# Patient Record
Sex: Male | Born: 1945 | Race: White | Hispanic: No | Marital: Married | Smoking: Former smoker
Health system: Southern US, Community
[De-identification: ages and names within clinical notes are randomized; demographics above are authoritative.]

## PROBLEM LIST (undated history)

## (undated) DIAGNOSIS — I10 Essential (primary) hypertension: Secondary | ICD-10-CM

## (undated) DIAGNOSIS — M1990 Unspecified osteoarthritis, unspecified site: Secondary | ICD-10-CM

## (undated) DIAGNOSIS — T7840XA Allergy, unspecified, initial encounter: Secondary | ICD-10-CM

## (undated) DIAGNOSIS — H269 Unspecified cataract: Secondary | ICD-10-CM

## (undated) DIAGNOSIS — F419 Anxiety disorder, unspecified: Secondary | ICD-10-CM

## (undated) DIAGNOSIS — E785 Hyperlipidemia, unspecified: Secondary | ICD-10-CM

## (undated) DIAGNOSIS — J449 Chronic obstructive pulmonary disease, unspecified: Secondary | ICD-10-CM

## (undated) DIAGNOSIS — K279 Peptic ulcer, site unspecified, unspecified as acute or chronic, without hemorrhage or perforation: Secondary | ICD-10-CM

## (undated) DIAGNOSIS — K219 Gastro-esophageal reflux disease without esophagitis: Secondary | ICD-10-CM

## (undated) DIAGNOSIS — J45909 Unspecified asthma, uncomplicated: Secondary | ICD-10-CM

## (undated) DIAGNOSIS — B019 Varicella without complication: Secondary | ICD-10-CM

## (undated) HISTORY — DX: Gastro-esophageal reflux disease without esophagitis: K21.9

## (undated) HISTORY — DX: Varicella without complication: B01.9

## (undated) HISTORY — DX: Unspecified osteoarthritis, unspecified site: M19.90

## (undated) HISTORY — DX: Unspecified cataract: H26.9

## (undated) HISTORY — DX: Peptic ulcer, site unspecified, unspecified as acute or chronic, without hemorrhage or perforation: K27.9

## (undated) HISTORY — DX: Unspecified asthma, uncomplicated: J45.909

## (undated) HISTORY — DX: Essential (primary) hypertension: I10

## (undated) HISTORY — DX: Anxiety disorder, unspecified: F41.9

## (undated) HISTORY — DX: Allergy, unspecified, initial encounter: T78.40XA

## (undated) HISTORY — DX: Hyperlipidemia, unspecified: E78.5

## (undated) HISTORY — DX: Chronic obstructive pulmonary disease, unspecified: J44.9

---
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
## 2014-12-08 LAB — HEPATIC FUNCTION PANEL
ALK PHOS: 178 — AB (ref 25–125)
ALT: 85 — AB (ref 10–40)
AST: 39 (ref 14–40)

## 2014-12-09 LAB — CBC AND DIFFERENTIAL
HCT: 34 — AB (ref 41–53)
Hemoglobin: 10.9 — AB (ref 13.5–17.5)
Platelets: 265 (ref 150–399)
WBC: 6.9

## 2014-12-10 LAB — BASIC METABOLIC PANEL
BUN: 18 (ref 4–21)
CREATININE: 1.4 — AB (ref <=1.3)
POTASSIUM: 4.3 (ref 3.4–5.3)
SODIUM: 145 (ref 137–147)

## 2017-04-19 ENCOUNTER — Encounter: Payer: Self-pay | Admitting: Internal Medicine

## 2017-04-19 ENCOUNTER — Encounter: Payer: Self-pay | Admitting: Family Medicine

## 2017-04-19 ENCOUNTER — Ambulatory Visit (INDEPENDENT_AMBULATORY_CARE_PROVIDER_SITE_OTHER): Payer: Medicare Other | Admitting: Family Medicine

## 2017-04-19 ENCOUNTER — Telehealth: Payer: Self-pay

## 2017-04-19 VITALS — BP 124/74 | HR 74 | Temp 97.9°F | Ht 73.0 in | Wt 199.2 lb

## 2017-04-19 DIAGNOSIS — R1013 Epigastric pain: Secondary | ICD-10-CM | POA: Diagnosis not present

## 2017-04-19 DIAGNOSIS — R3912 Poor urinary stream: Secondary | ICD-10-CM | POA: Diagnosis not present

## 2017-04-19 DIAGNOSIS — K2100 Gastro-esophageal reflux disease with esophagitis, without bleeding: Secondary | ICD-10-CM | POA: Insufficient documentation

## 2017-04-19 DIAGNOSIS — R195 Other fecal abnormalities: Secondary | ICD-10-CM | POA: Diagnosis not present

## 2017-04-19 DIAGNOSIS — Z1159 Encounter for screening for other viral diseases: Secondary | ICD-10-CM

## 2017-04-19 DIAGNOSIS — K921 Melena: Secondary | ICD-10-CM | POA: Diagnosis not present

## 2017-04-19 DIAGNOSIS — N401 Enlarged prostate with lower urinary tract symptoms: Secondary | ICD-10-CM | POA: Diagnosis not present

## 2017-04-19 DIAGNOSIS — I959 Hypotension, unspecified: Secondary | ICD-10-CM | POA: Diagnosis not present

## 2017-04-19 DIAGNOSIS — N281 Cyst of kidney, acquired: Secondary | ICD-10-CM | POA: Diagnosis not present

## 2017-04-19 DIAGNOSIS — I700 Atherosclerosis of aorta: Secondary | ICD-10-CM | POA: Diagnosis not present

## 2017-04-19 DIAGNOSIS — E7800 Pure hypercholesterolemia, unspecified: Secondary | ICD-10-CM

## 2017-04-19 LAB — CBC
HCT: 42.8 % (ref 39.0–52.0)
Hemoglobin: 14.4 g/dL (ref 13.0–17.0)
MCHC: 33.7 g/dL (ref 30.0–36.0)
MCV: 95.5 fl (ref 78.0–100.0)
Platelets: 318 10*3/uL (ref 150.0–400.0)
RBC: 4.48 Mil/uL (ref 4.22–5.81)
RDW: 12.6 % (ref 11.5–15.5)
WBC: 6.6 10*3/uL (ref 4.0–10.5)

## 2017-04-19 LAB — LIPID PANEL
CHOL/HDL RATIO: 4
Cholesterol: 204 mg/dL — ABNORMAL HIGH (ref 0–200)
HDL: 46.1 mg/dL (ref >39.00)
NONHDL: 157.52
TRIGLYCERIDES: 202 mg/dL — AB (ref 0.0–149.0)
VLDL: 40.4 mg/dL — ABNORMAL HIGH (ref 0.0–40.0)

## 2017-04-19 LAB — URINALYSIS, ROUTINE W REFLEX MICROSCOPIC
Bilirubin Urine: NEGATIVE
HGB URINE DIPSTICK: NEGATIVE
KETONES UR: NEGATIVE
LEUKOCYTES UA: NEGATIVE
Nitrite: NEGATIVE
RBC / HPF: NONE SEEN (ref 0–?)
Specific Gravity, Urine: 1.025 (ref 1.000–1.030)
Total Protein, Urine: NEGATIVE
URINE GLUCOSE: NEGATIVE
Urobilinogen, UA: 0.2 (ref 0.0–1.0)
pH: 5.5 (ref 5.0–8.0)

## 2017-04-19 LAB — LDL CHOLESTEROL, DIRECT: Direct LDL: 143 mg/dL

## 2017-04-19 LAB — COMPREHENSIVE METABOLIC PANEL
ALT: 19 U/L (ref 0–53)
AST: 21 U/L (ref 0–37)
Albumin: 4.5 g/dL (ref 3.5–5.2)
Alkaline Phosphatase: 41 U/L (ref 39–117)
BILIRUBIN TOTAL: 0.5 mg/dL (ref 0.2–1.2)
BUN: 17 mg/dL (ref 6–23)
CALCIUM: 9.4 mg/dL (ref 8.4–10.5)
CHLORIDE: 104 meq/L (ref 96–112)
CO2: 24 meq/L (ref 19–32)
CREATININE: 0.98 mg/dL (ref 0.40–1.50)
GFR: 79.71 mL/min (ref >60.00)
GLUCOSE: 95 mg/dL (ref 70–99)
Potassium: 4.9 mEq/L (ref 3.5–5.1)
SODIUM: 142 meq/L (ref 135–145)
Total Protein: 6.7 g/dL (ref 6.0–8.3)

## 2017-04-19 LAB — PSA: PSA: 0.42 ng/mL (ref 0.10–4.00)

## 2017-04-19 LAB — TSH: TSH: 3.25 u[IU]/mL (ref 0.35–4.50)

## 2017-04-19 LAB — AMYLASE: Amylase: 68 U/L (ref 27–131)

## 2017-04-19 MED ORDER — TAMSULOSIN HCL 0.4 MG PO CAPS: 0.4000 mg | ORAL_CAPSULE | Freq: Every day | ORAL | 3 refills | Status: DC

## 2017-04-19 NOTE — Telephone Encounter (Signed)
FYI - patient was taking prilosec but he stopped taking it & does not want to take anything else right now.  Copied from CRM [id number]. Topic: General - Other >> Apr 19, 2017  2:23 PM Percival SpanishKennedy, Cheryl W wrote:  Pt said he is already taking omeprazole which is the same as prilosec which here already takes and he stop taking it because it caused him diarrh and upset his stomach. He said he want take anything right now

## 2017-04-19 NOTE — Progress Notes (Addendum)
Subjective:  Patient ID: Daniel Blake, male    DOB: April 28, 1945  Age: 72 y.o. MRN: [id number]  CC: Establish Care   HPI Daniel Blake presents for establishment of care and for follow-up of multiple medical issues.  He tells of ongoing hematochezia.  He also has midepigastric pain.  He also has reflux.  He was taking a medicine for his reflux that gave him diarrhea.  He stopped taking that medicine.  He is not sure what the medicine is.  He tells me that all of those medicines are the same.  He says that he had a colonoscopy a few years ago and multiple polyps were discovered.  He brings in blood work taken at the middle of last month that showed a low normal hemoglobin with a low normal iron studies.  Urine urinalysis and CMP were essentially normal.  He has smoked all of his life but quit 3 months ago. He also quit drinking 3 mos ago as well.  When I asked him about how much he was smoking it was hard to get a specific answer from him.  When I asked him about why he quits drinking he said that he wanted to feel better.  He said that he would average 2 beers every other day and that he was not drinking alcoholically.  He tells me that he has had ongoing issues with hypotension.  He is actually been hospitalized for this problem.  It was not clear to me then why he was taking Benicar 10 mg daily.  He saw his cardiologist last month who suggested that he stop it.  Patient denies a history of heart attack stroke or vascular disease.  He said that he was seeing the cardiologist because he has been hospitalized in the past for hypotension.  He is retired and lives with his wife.  He owned and operated several Administrator, sports.  History April has a past medical history of Allergy, Chicken pox, GERD (gastroesophageal reflux disease), Hyperlipidemia, and Hypertension.   He has a past surgical history that includes Tonsillectomy (1962).   His family history includes Arthritis in his sister; Asthma in his father;  COPD in his father; Early death in his brother and sister; Heart attack in his brother; Heart disease in his brother, brother, and mother; Hyperlipidemia in his father and mother; Hypertension in his father and mother; Stroke in his father.He reports that he has quit smoking. he has never used smokeless tobacco. He reports that he does not drink alcohol or use drugs.  Outpatient Medications Prior to Visit  Medication Sig Dispense Refill  . aspirin EC 81 MG tablet Take 81 mg by mouth daily.    Marland Kitchen ezetimibe (ZETIA) 10 MG tablet Take 1 tablet by mouth daily.    . fexofenadine (ALLEGRA) 180 MG tablet Take 180 mg by mouth daily.    . Saw Palmetto 160 MG CAPS Take 450 mg by mouth daily.    Marland Kitchen ALPRAZolam (XANAX) 0.25 MG tablet Take 1 tablet by mouth two times daily as needed for sleep or anxiety.    Marland Kitchen olmesartan (BENICAR) 20 MG tablet Take 10 mg by mouth daily.     No facility-administered medications prior to visit.     ROS Review of Systems  Constitutional: Positive for fatigue. Negative for chills and fever.  HENT: Negative.   Eyes: Negative.   Respiratory: Negative for shortness of breath and wheezing.   Cardiovascular: Negative for chest pain and palpitations.  Gastrointestinal: Positive for abdominal  pain and blood in stool. Negative for constipation, nausea and vomiting.  Endocrine: Negative for polyphagia and polyuria.  Genitourinary: Positive for difficulty urinating, frequency and urgency. Negative for hematuria.  Musculoskeletal: Negative for gait problem and myalgias.  Skin: Negative.   Allergic/Immunologic: Negative for immunocompromised state.  Neurological: Negative for seizures, speech difficulty and headaches.  Hematological: Does not bruise/bleed easily.  Psychiatric/Behavioral: Negative.     Objective:  BP 124/74 (BP Location: Left Arm, Patient Position: Sitting, Cuff Size: Normal)   Pulse 74   Temp 97.9 F (36.6 C) (Oral)   Ht 6\' 1"  (1.854 m)   Wt 199 lb 4 oz (90.4  kg)   SpO2 97%   BMI 26.29 kg/m   Physical Exam  Constitutional: He is oriented to person, place, and time. He appears well-developed and well-nourished. No distress.  HENT:  Head: Normocephalic and atraumatic.  Right Ear: External ear normal.  Left Ear: External ear normal.  Mouth/Throat: Oropharynx is clear and moist. No oropharyngeal exudate.  Eyes: Conjunctivae are normal. Pupils are equal, round, and reactive to light. Right eye exhibits no discharge. Left eye exhibits no discharge. No scleral icterus.  Neck: Neck supple. No JVD present. No tracheal deviation present. No thyromegaly present.  Cardiovascular: Normal rate, regular rhythm and normal heart sounds.  Pulmonary/Chest: Effort normal and breath sounds normal. No stridor. No respiratory distress. He has no wheezes. He has no rales.  Abdominal: Bowel sounds are normal. He exhibits no distension. There is no tenderness. There is no rebound and no guarding.  Genitourinary: Rectal exam shows external hemorrhoid (there is an external hemorrhoid that is non thrombosed and not bleeding. ) and guaiac positive stool. Rectal exam shows no internal hemorrhoid, no fissure, no mass, no tenderness and anal tone normal. Prostate is not enlarged and not tender.  Lymphadenopathy:    He has no cervical adenopathy.  Neurological: He is alert and oriented to person, place, and time.  Skin: Skin is warm and dry. He is not diaphoretic.  Psychiatric: He has a normal mood and affect. His behavior is normal.      Assessment & Plan:   [name] was seen today for establish care.  Diagnoses and all orders for this visit:  Heme positive stool -     Cancel: Fecal occult blood, imunochemical -     US Abdomen Complete; Future -     Ambulatory referral to Gastroenterology -     Cancel: Fecal occult blood, imunochemical; Future  Epigastric pain -     CBC -     Comprehensive metabolic panel -     Amylase -     US Abdomen Complete; Future -      Ambulatory referral to Gastroenterology  Hematochezia -     Cancel: Fecal occult blood, imunochemical -     CBC -     Ambulatory referral to Gastroenterology  Reflux esophagitis -     Comprehensive metabolic panel -     Ambulatory referral to Gastroenterology  Hypotension, unspecified hypotension type -     TSH -     Urinalysis, Routine w reflex microscopic  Elevated cholesterol -     Comprehensive metabolic panel -     Lipid panel  Benign prostatic hyperplasia with weak urinary stream -     PSA -     tamsulosin (FLOMAX) 0.4 MG CAPS capsule; Take 1 capsule (0.4 mg total) by mouth daily.  Encounter for hepatitis C screening test for low risk patient -  Hepatitis C antibody  Complex renal cyst  Atherosclerosis of aorta (HCC)  Other orders -     LDL cholesterol, direct   I have discontinued Jillyn HiddenGary Killgore's ALPRAZolam and olmesartan. I am also having him start on tamsulosin. Additionally, I am having him maintain his ezetimibe, Saw Palmetto, fexofenadine, and aspirin EC.  Meds ordered this encounter  Medications  . tamsulosin (FLOMAX) 0.4 MG CAPS capsule    Sig: Take 1 capsule (0.4 mg total) by mouth daily.    Dispense:  30 capsule    Refill:  3   I have asked the patient to hold his Benicar and start checking his blood pressures.  We will give him a trial of Flomax.  Even though the patient had a recent colonoscopy he tells me, combination of midepigastric pain, reflux, hematochezia and a low hemoglobin warrant abdominal ultrasound and referral to GI.  Follow-up: Return in about 1 month (around 05/20/2017).  Mliss SaxWilliam Alfred Kremer, MD

## 2017-04-20 ENCOUNTER — Ambulatory Visit (HOSPITAL_BASED_OUTPATIENT_CLINIC_OR_DEPARTMENT_OTHER)
Admission: RE | Admit: 2017-04-20 | Discharge: 2017-04-20 | Disposition: A | Discharge status: Home / Self Care | Payer: Medicare Other | Source: Ambulatory Visit | Attending: Family Medicine | Admitting: Family Medicine

## 2017-04-20 ENCOUNTER — Encounter (INDEPENDENT_AMBULATORY_CARE_PROVIDER_SITE_OTHER): Payer: Self-pay

## 2017-04-20 DIAGNOSIS — I700 Atherosclerosis of aorta: Secondary | ICD-10-CM | POA: Diagnosis not present

## 2017-04-20 DIAGNOSIS — N281 Cyst of kidney, acquired: Secondary | ICD-10-CM | POA: Diagnosis not present

## 2017-04-20 DIAGNOSIS — R1013 Epigastric pain: Secondary | ICD-10-CM | POA: Diagnosis not present

## 2017-04-20 DIAGNOSIS — R195 Other fecal abnormalities: Secondary | ICD-10-CM

## 2017-04-20 LAB — HEPATITIS C ANTIBODY
HEP C AB: NONREACTIVE
SIGNAL TO CUT-OFF: 0.01 (ref <1.00)

## 2017-04-20 NOTE — Addendum Note (Signed)
Addended by: Varney BilesWIESNER, Courteney Alderete M on: 04/20/2017 07:52 AM   Modules accepted: Orders

## 2017-04-21 ENCOUNTER — Other Ambulatory Visit: Payer: Medicare Other

## 2017-04-21 ENCOUNTER — Other Ambulatory Visit: Payer: Self-pay

## 2017-04-21 DIAGNOSIS — R195 Other fecal abnormalities: Secondary | ICD-10-CM

## 2017-04-21 DIAGNOSIS — K921 Melena: Secondary | ICD-10-CM

## 2017-04-21 NOTE — Addendum Note (Signed)
Addended by: Arva ChafeGARCIA, Lily Velasquez M on: 04/21/2017 12:31 PM   Modules accepted: Orders

## 2017-04-21 NOTE — Progress Notes (Signed)
Pt dropped off hemoccult kit.

## 2017-04-24 DIAGNOSIS — I700 Atherosclerosis of aorta: Secondary | ICD-10-CM | POA: Insufficient documentation

## 2017-04-24 DIAGNOSIS — N281 Cyst of kidney, acquired: Secondary | ICD-10-CM | POA: Insufficient documentation

## 2017-04-25 ENCOUNTER — Other Ambulatory Visit: Payer: Self-pay

## 2017-04-25 ENCOUNTER — Other Ambulatory Visit (INDEPENDENT_AMBULATORY_CARE_PROVIDER_SITE_OTHER): Payer: Medicare Other

## 2017-04-25 DIAGNOSIS — K921 Melena: Secondary | ICD-10-CM

## 2017-04-25 DIAGNOSIS — Z1211 Encounter for screening for malignant neoplasm of colon: Secondary | ICD-10-CM

## 2017-04-25 LAB — FECAL OCCULT BLOOD, IMMUNOCHEMICAL: Fecal Occult Bld: NEGATIVE

## 2017-05-19 ENCOUNTER — Encounter: Payer: Self-pay | Admitting: Family Medicine

## 2017-05-19 ENCOUNTER — Ambulatory Visit (INDEPENDENT_AMBULATORY_CARE_PROVIDER_SITE_OTHER): Payer: Medicare Other | Admitting: Family Medicine

## 2017-05-19 VITALS — BP 126/80 | HR 68 | Ht 73.0 in | Wt 201.5 lb

## 2017-05-19 DIAGNOSIS — N281 Cyst of kidney, acquired: Secondary | ICD-10-CM

## 2017-05-19 DIAGNOSIS — K921 Melena: Secondary | ICD-10-CM

## 2017-05-19 DIAGNOSIS — K2100 Gastro-esophageal reflux disease with esophagitis, without bleeding: Secondary | ICD-10-CM

## 2017-05-19 DIAGNOSIS — R195 Other fecal abnormalities: Secondary | ICD-10-CM

## 2017-05-19 NOTE — Progress Notes (Signed)
Subjective:  Patient ID: Daniel Blake, male    DOB: 1945-06-25  Age: 72 y.o. MRN: [id number]  CC: Follow-up   HPI Daniel Blake presents for follow-up of his hypotension, midepigastric pain and heme positive stool.  He is no longer taking Benicar.  He brings in a list of blood pressures taken since our last visit.  They are all in the less than 140 over less than 90 range.  It sounds as though he is taking the omeprazole with some relief of his midepigastric pain.  He is scheduled to see gastroenterology on the 15th of this month for follow-up of his midepigastric pain and heme-positive stool.  Outpatient Medications Prior to Visit  Medication Sig Dispense Refill  . aspirin EC 81 MG tablet Take 81 mg by mouth daily.    Marland Kitchen. ezetimibe (ZETIA) 10 MG tablet Take 1 tablet by mouth daily.    . fexofenadine (ALLEGRA) 180 MG tablet Take 180 mg by mouth daily.    . Saw Palmetto 160 MG CAPS Take 450 mg by mouth daily.    Marland Kitchen. ALPRAZolam (XANAX) 0.25 MG tablet Take 1 tablet by mouth 2 (two) times daily.  0  . tamsulosin (FLOMAX) 0.4 MG CAPS capsule Take 1 capsule (0.4 mg total) by mouth daily. 30 capsule 3   No facility-administered medications prior to visit.     ROS Review of Systems  Respiratory: Negative.   Cardiovascular: Negative.   Gastrointestinal: Negative.   Genitourinary: Negative.   Psychiatric/Behavioral: Negative.     Objective:  BP 126/80 (BP Location: Left Arm, Patient Position: Sitting, Cuff Size: Normal)   Pulse 68   Ht 6\' 1"  (1.854 m)   Wt 201 lb 8 oz (91.4 kg)   SpO2 97%   BMI 26.58 kg/m   BP Readings from Last 3 Encounters:  05/19/17 126/80  04/19/17 124/74    Wt Readings from Last 3 Encounters:  05/19/17 201 lb 8 oz (91.4 kg)  04/19/17 199 lb 4 oz (90.4 kg)    Physical Exam  Constitutional: He appears well-developed and well-nourished. No distress.  HENT:  Head: Normocephalic and atraumatic.  Right Ear: External ear normal.  Left Ear: External ear normal.    Eyes: Right eye exhibits no discharge. Left eye exhibits no discharge. No scleral icterus.  Pulmonary/Chest: Effort normal.  Neurological: He is alert.  Skin: He is not diaphoretic.  Psychiatric: He has a normal mood and affect. His behavior is normal.    Lab Results  Component Value Date   WBC 6.6 04/19/2017   HGB 14.4 04/19/2017   HCT 42.8 04/19/2017   PLT 318.0 04/19/2017   GLUCOSE 95 04/19/2017   CHOL 204 (H) 04/19/2017   TRIG 202.0 (H) 04/19/2017   HDL 46.10 04/19/2017   LDLDIRECT 143.0 04/19/2017   ALT 19 04/19/2017   AST 21 04/19/2017   NA 142 04/19/2017   K 4.9 04/19/2017   CL 104 04/19/2017   CREATININE 0.98 04/19/2017   BUN 17 04/19/2017   CO2 24 04/19/2017   TSH 3.25 04/19/2017   PSA 0.42 04/19/2017    [place] Abdomen Complete  Result Date: 04/21/2017 CLINICAL DATA:  Epigastric pain. EXAM: ABDOMEN ULTRASOUND COMPLETE COMPARISON:  12/08/2014 FINDINGS: Gallbladder: No gallstones or wall thickening visualized. No sonographic Murphy sign noted by sonographer. Common bile duct: Diameter: 3.3 mm Liver: No focal lesion identified. Within normal limits in parenchymal echogenicity. Portal vein is patent on color Doppler imaging with normal direction of blood flow towards the liver. IVC: No  abnormality visualized. Pancreas: Visualized portion unremarkable. Spleen: Measures 13.3 cm and has a volume of 367 cc. Right Kidney: Length: 11.2 cm. Small right kidney cysts are identified within the mid and lower pole. The cyst in the midpole has a maximum diameter 1.3 cm and is complicated by internal areas of septation. 1.1 cm cyst within the inferior pole of the right kidney may also contains thin internal areas of septation. Left Kidney: Length: 12.2 cm. Echogenicity within normal limits. No mass or hydronephrosis visualized. Abdominal aorta: No aneurysm visualized. Aortic atherosclerosis noted. Other findings: None. IMPRESSION: 1. No acute findings. 2. Right kidney cysts appear mildly  complicated by internal areas of septation. These do not meet criteria for simple cysts. 3. Aortic atherosclerosis. Electronically Signed   By: Signa Kell M.D.   On: 04/21/2017 08:51    Assessment & Plan:   Daniel Blake was seen today for follow-up.  Diagnoses and all orders for this visit:  Hematochezia  Heme positive stool  Complex renal cyst -     Ambulatory referral to Urology  Reflux esophagitis   I have discontinued Daniel Blake's tamsulosin. I am also having him maintain his ezetimibe, Saw Palmetto, fexofenadine, aspirin EC, and ALPRAZolam.  No orders of the defined types were placed in this encounter.  Patient is a gastroenterology on the 15th and will be scheduled to see urology for his complex renal cyst.  Follow-up: No Follow-up on file.  Mliss Sax, MD

## 2017-05-25 ENCOUNTER — Ambulatory Visit (INDEPENDENT_AMBULATORY_CARE_PROVIDER_SITE_OTHER): Payer: Medicare Other | Admitting: Internal Medicine

## 2017-05-25 ENCOUNTER — Encounter: Payer: Self-pay | Admitting: Internal Medicine

## 2017-05-25 VITALS — BP 128/68 | HR 62 | Ht 73.0 in | Wt 201.0 lb

## 2017-05-25 DIAGNOSIS — Z86010 Personal history of colonic polyps: Secondary | ICD-10-CM | POA: Diagnosis not present

## 2017-05-25 DIAGNOSIS — K2100 Gastro-esophageal reflux disease with esophagitis, without bleeding: Secondary | ICD-10-CM

## 2017-05-25 DIAGNOSIS — K222 Esophageal obstruction: Secondary | ICD-10-CM

## 2017-05-25 DIAGNOSIS — Z8719 Personal history of other diseases of the digestive system: Secondary | ICD-10-CM | POA: Diagnosis not present

## 2017-05-25 DIAGNOSIS — R1310 Dysphagia, unspecified: Secondary | ICD-10-CM

## 2017-05-25 NOTE — Patient Instructions (Signed)
Continue to take your Omeprazole over the counter.  [facility]  Please follow up in one year

## 2017-05-25 NOTE — Progress Notes (Signed)
HISTORY OF PRESENT ILLNESS:  Daniel Blake is a 72 y.o. male referred by Dr. Farris HasKramer with a chief complaint of "stomach issues".. Patient has a GI history and [place] as recent as less than 1 year ago. Wants to change to this healthcare system. Saw his PCP regarding possible GI bleeding. However, Hemoccult testing was negative. Review of laboratories from 04/19/2017 shows normal hemoglobin of 14.4. Comprehensive metabolic panel is normal. Review of outside GI records from Dr. Conley RollsLe dated 06/14/2016 states that the patient underwent upper endoscopy and colonoscopy in January 2017. Upper endoscopy was said to show grade A erosive esophagitis and small duodenal ulcers. Gastric biopsies were negative for Helicobacter pylori. Also noted to have an esophageal stricture. He does have some mild intermittent solid food dysphagia which she states is not progressive or bothersome. Colonoscopy revealed several tubular adenomas. No comment regarding follow-up made. Patient tells me that he has intermittent problems with epigastric pain and active reflux symptoms. He does note that when taking omeprazole regularly, symptoms improved. However, he is concerned about taking medication regularly as the over-the-counter package insert says not to take for more than 14 days. Because of abdominal complaints she did undergo abdominal ultrasound on 04/20/2017. This was negative for acute abnormalities. Normal gallbladder without stones. Does have alternating bowel habits but denies melena or hematochezia except for blood on the tissue with wiping that is attributed to known hemorrhoids.  REVIEW OF SYSTEMS:  All non-GI ROS negative unless otherwise stated in the history of present illness except for arthritis, depression, fatigue, sleeping problems, excessive urination, urinary leakage  Past Medical History:  Diagnosis Date  . Allergy   . Chicken pox   . GERD (gastroesophageal reflux disease)   . Hyperlipidemia   . Hypertension    . PUD (peptic ulcer disease)     Past Surgical History:  Procedure Laterality Date  . TONSILLECTOMY  1962    Social History Daniel LatusGary Knarr  reports that he has quit smoking. he has never used smokeless tobacco. He reports that he does not drink alcohol or use drugs.  family history includes Arthritis in his sister; Asthma in his father; COPD in his father; Early death in his brother and sister; Heart attack in his brother; Heart disease in his brother, brother, and mother; Hyperlipidemia in his father and mother; Hypertension in his father and mother; Stroke in his father.  Allergies  Allergen Reactions  . Bee Venom Anaphylaxis  . Amlodipine Other (See Comments)    Tired , foggy feeling  Tired , foggy feeling    . Olmesartan Other (See Comments)    AKI  . Pravastatin Other (See Comments)    Myalgia Myalgia   . Rosuvastatin Other (See Comments)       PHYSICAL EXAMINATION: Vital signs: BP 128/68   Pulse 62   Ht 6\' 1"  (1.854 m)   Wt 201 lb (91.2 kg)   BMI 26.52 kg/m   Constitutional: generally well-appearing, no acute distress Psychiatric: alert and oriented x3, cooperative Eyes: extraocular movements intact, anicteric, conjunctiva pink Mouth: oral pharynx moist, no lesions Neck: supple no lymphadenopathy Cardiovascular: heart regular rate and rhythm, no murmur Lungs: clear to auscultation bilaterally Abdomen: soft, nontender, nondistended, no obvious ascites, no peritoneal signs, normal bowel sounds, no organomegaly Rectal: Omitted Extremities: no clubbing, cyanosis, or lower extremity edema bilaterally Skin: no lesions on visible extremities Neuro: No focal deficits. Cranial nerves intact  ASSESSMENT:  #1. GERD with history of erosive esophagitis. Active symptoms off PPI. #2. Intermittent  solid food dysphagia secondary to known peptic stricture #3. History of duodenal ulcer. Negative Helicobacter pylori testing #4. Last colonoscopy elsewhere January 2017 with  tubular adenomas #5. History of duodenal ulcer  PLAN:  #1. Reflux precautions. Also, avoid NSAIDs. Okay to take baby aspirin if medically necessary #2. Omeprazole 20 mg daily. Advised to take every day given his chronic symptoms, the presence of esophageal erosions, and the presence of an esophageal stricture. Offered prescription. The patient prefers OTC #3. Obtain formal colonoscopy, endoscopy, and pathology reports from Dr. Conley Rolls. Release signed #4. Surveillance colonoscopy likely due around January 2022 #5. Routine office follow-up one year. He agrees to follow-up

## 2017-09-16 ENCOUNTER — Other Ambulatory Visit: Payer: Self-pay | Admitting: Family Medicine

## 2017-10-09 ENCOUNTER — Encounter: Payer: Self-pay | Admitting: Family Medicine

## 2017-10-09 ENCOUNTER — Ambulatory Visit (INDEPENDENT_AMBULATORY_CARE_PROVIDER_SITE_OTHER): Payer: Medicare Other | Admitting: Family Medicine

## 2017-10-09 VITALS — BP 118/80 | HR 68 | Temp 97.7°F | Ht 73.0 in | Wt 202.2 lb

## 2017-10-09 DIAGNOSIS — H60501 Unspecified acute noninfective otitis externa, right ear: Secondary | ICD-10-CM | POA: Insufficient documentation

## 2017-10-09 MED ORDER — NEOMYCIN-POLYMYXIN-HC 1 % OT SOLN: 3.0000 [drp] | Freq: Three times a day (TID) | OTIC | 0 refills | Status: AC

## 2017-10-09 NOTE — Progress Notes (Signed)
Subjective:  Patient ID: Daniel Blake, male    DOB: [birth year]  Age: 72 y.o. MRN: [id number]  CC: Pain (right side below the ear, pain has diminished now, but pain was worse last week.)   HPI Daniel Blake presents for evaluation of a 2 to 3-day history of right ear discomfort.  Hearing is not affected with [place] with this but he does have fluttering in the ear at times with some ear congestion.  He has ongoing postnasal drip that he treats with Allegra.  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  He has ground his teeth in the past when he was under stress.  He is not doing this currently according to his wife.  He does not have pain when he chews his food.  He does feel or hear a click sometimes when he open his opens his mouth wide.  Outpatient Medications Prior to Visit  Medication Sig Dispense Refill  . ALPRAZolam (XANAX) 0.25 MG tablet TAKE 1 TABLET BY MOUTH TWICE DAILY AS NEEDED FOR SLEEP OR ANXIETY 60 tablet 0  . aspirin EC 81 MG tablet Take 81 mg by mouth daily.    Marland Kitchen BENICAR 20 MG tablet Take 1 tablet by mouth daily.  0  . calcium-vitamin D (OSCAL WITH D) 500-200 MG-UNIT tablet Take 1 tablet by mouth daily with breakfast.    . cholecalciferol (VITAMIN D) 1000 units tablet Take 1,000 Units by mouth daily.    Marland Kitchen ezetimibe (ZETIA) 10 MG tablet Take 1 tablet by mouth daily.    . fexofenadine (ALLEGRA) 180 MG tablet Take 180 mg by mouth daily.    Daniel Blake Oil 1000 MG CAPS Take 1 capsule by mouth daily.    . magnesium 30 MG tablet Take 30 mg by mouth daily.    Marland Kitchen omeprazole (PRILOSEC) 20 MG capsule Take 20 mg by mouth daily.    . Saw Palmetto 160 MG CAPS Take 450 mg by mouth daily.    . tamsulosin (FLOMAX) 0.4 MG CAPS capsule Take 1 capsule by mouth daily.  3  . Testosterone 20.25 MG/ACT (1.62%) GEL APPLY 2 SPRAYS ON THE SKIN D  2  . zinc gluconate 50 MG tablet Take 50 mg by mouth daily.    Marland Kitchen testosterone (ANDROGEL) 50  MG/5GM (1%) GEL Place 5 g onto the skin daily.     No facility-administered medications prior to visit.     ROS Review of Systems  Constitutional: Negative for chills, fatigue and fever.  HENT: Positive for ear pain and postnasal drip. Negative for ear discharge, hearing loss, rhinorrhea, sore throat, trouble swallowing and voice change.   Eyes: Negative.   Respiratory: Negative.   Cardiovascular: Negative.   Gastrointestinal: Negative.   Musculoskeletal: Positive for arthralgias.  Skin: Negative for color change, pallor and rash.  Neurological: Negative for syncope, weakness and headaches.  Hematological: Negative for adenopathy. Does not bruise/bleed easily.  Psychiatric/Behavioral: Negative.     Objective:  BP 118/80   Pulse 68   Temp 97.7 F (36.5 C)   Ht 6\' 1"  (1.854 m)   Wt 202 lb 4 oz (91.7 kg)   SpO2 98%   BMI 26.68 kg/m   BP Readings from Last 3 Encounters:  10/09/17 118/80  05/25/17 128/68  05/19/17 126/80    Wt Readings from Last 3 Encounters:  10/09/17 202 lb 4 oz (91.7 kg)  05/25/17 201 lb (  91.2 kg)  05/19/17 201 lb 8 oz (91.4 kg)    Physical Exam  Constitutional: He is oriented to person, place, and time. He appears well-developed and well-nourished. No distress.  HENT:  Head: Normocephalic and atraumatic.    Right Ear: Hearing and tympanic membrane normal.  Left Ear: Hearing, tympanic membrane, external ear and ear canal normal.  Ears:  Nose: Nose normal.  Mouth/Throat: Oropharynx is clear and moist. No oropharyngeal exudate.  Eyes: Pupils are equal, round, and reactive to light. Conjunctivae and EOM are normal. Right eye exhibits no discharge. Left eye exhibits no discharge. No scleral icterus.  Neck: Normal range of motion. Neck supple. No JVD present. No tracheal deviation present. No thyromegaly present.  Cardiovascular: Normal rate, regular rhythm and normal heart sounds.  Pulmonary/Chest: Effort normal and breath sounds normal.    Lymphadenopathy:    He has no cervical adenopathy.  Neurological: He is alert and oriented to person, place, and time.  Skin: Skin is warm and dry. He is not diaphoretic.  Psychiatric: He has a normal mood and affect. His behavior is normal.    Lab Results  Component Value Date   WBC 6.6 04/19/2017   HGB 14.4 04/19/2017   HCT 42.8 04/19/2017   PLT 318.0 04/19/2017   GLUCOSE 95 04/19/2017   CHOL 204 (H) 04/19/2017   TRIG 202.0 (H) 04/19/2017   HDL 46.10 04/19/2017   LDLDIRECT 143.0 04/19/2017   ALT 19 04/19/2017   AST 21 04/19/2017   NA 142 04/19/2017   K 4.9 04/19/2017   CL 104 04/19/2017   CREATININE 0.98 04/19/2017   BUN 17 04/19/2017   CO2 24 04/19/2017   TSH 3.25 04/19/2017   PSA 0.42 04/19/2017    [place] Abdomen Complete  Result Date: 04/21/2017 CLINICAL DATA:  Epigastric pain. EXAM: ABDOMEN ULTRASOUND COMPLETE COMPARISON:  12/08/2014 FINDINGS: Gallbladder: No gallstones or wall thickening visualized. No sonographic Murphy sign noted by sonographer. Common bile duct: Diameter: 3.3 mm Liver: No focal lesion identified. Within normal limits in parenchymal echogenicity. Portal vein is patent on color Doppler imaging with normal direction of blood flow towards the liver. IVC: No abnormality visualized. Pancreas: Visualized portion unremarkable. Spleen: Measures 13.3 cm and has a volume of 367 cc. Right Kidney: Length: 11.2 cm. Small right kidney cysts are identified within the mid and lower pole. The cyst in the midpole has a maximum diameter 1.3 cm and is complicated by internal areas of septation. 1.1 cm cyst within the inferior pole of the right kidney may also contains thin internal areas of septation. Left Kidney: Length: 12.2 cm. Echogenicity within normal limits. No mass or hydronephrosis visualized. Abdominal aorta: No aneurysm visualized. Aortic atherosclerosis noted. Other findings: None. IMPRESSION: 1. No acute findings. 2. Right kidney cysts appear mildly complicated by  internal areas of septation. These do not meet criteria for simple cysts. 3. Aortic atherosclerosis. Electronically Signed   By: Signa Kellaylor  Stroud M.D.   On: 04/21/2017 08:51    Assessment & Plan:   Jillyn HiddenGary was seen today for pain.  Diagnoses and all orders for this visit:  Acute otitis externa of right ear, unspecified type -     NEOMYCIN-POLYMYXIN-HYDROCORTISONE (CORTISPORIN) 1 % SOLN OTIC solution; Place 3 drops into the right ear every 8 (eight) hours for 5 days.  She has I am having Marlana LatusGary Ginther start on NEOMYCIN-POLYMYXIN-HYDROCORTISONE. I am also having him maintain his ezetimibe, Saw Palmetto, fexofenadine, aspirin EC, cholecalciferol, calcium-vitamin D, Krill Oil, zinc gluconate, magnesium, omeprazole, ALPRAZolam, Testosterone,  tamsulosin, and BENICAR.  Meds ordered this encounter  Medications  . NEOMYCIN-POLYMYXIN-HYDROCORTISONE (CORTISPORIN) 1 % SOLN OTIC solution    Sig: Place 3 drops into the right ear every 8 (eight) hours for 5 days.    Dispense:  10 mL    Refill:  0   Believe possible early otitis externa.  And will treat with Cortisporin Otic.  Suggested that he use Flonase given to help control his postnasal drip which would help his eustachian tube function.  Follow-up with dentist or further development of TMJ pain or clicking.  Follow-up here in a week or so if he is not improving.  Follow-up: Return in about 1 week (around 10/16/2017), or if symptoms worsen or fail to improve.  Mliss Sax, MD

## 2017-10-11 ENCOUNTER — Ambulatory Visit: Payer: Self-pay

## 2017-10-11 NOTE — Telephone Encounter (Signed)
FYI - patient declined appointment for today, patient will let us know if he would like to come in & was instructed to go to the ED if he develops chest pain, SOB, or if he becomes worse.

## 2017-10-11 NOTE — Telephone Encounter (Signed)
Pt. Reports last week he became sick feeling while playing golf. BP was 74/40 and he was "weak". Still feels weak. Asking for a referral to cardiology. Ronnie in the practice advises pt. Needs to be seen for that. Offered an appointment for today. Pt. Refuses. States "I think about it awhile and let you know." Instructed if symptoms worsen or he develops shortness of breath, chest pain to go to ED. Verbalizes understanding.  Answer Assessment - Initial Assessment Questions 1. BLOOD PRESSURE: "What is the blood pressure?" "Did you take at least two measurements 5 minutes apart?"     Last week 74/40 2. ONSET: "When did you take your blood pressure?"     Last week 3. HOW: "How did you obtain the blood pressure?" (e.g., visiting nurse, automatic home BP monitor)     Home BP monitor 4. HISTORY: "Do you have a history of low blood pressure?" "What is your blood pressure normally?"     No - takes meds for high BP 5. MEDICATIONS: "Are you taking any medications for blood pressure?" If yes: "Have they been changed recently?"     Yes 6. PULSE RATE: "Do you know what your pulse rate is?"      Unsure 7. OTHER SYMPTOMS: "Have you been sick recently?" "Have you had a recent injury?"     No 8. PREGNANCY: "Is there any chance you are pregnant?" "When was your last menstrual period?"     n/a  Protocols used: LOW BLOOD PRESSURE-A-AH

## 2017-10-16 ENCOUNTER — Telehealth: Payer: Self-pay

## 2017-10-16 DIAGNOSIS — Z91038 Other insect allergy status: Secondary | ICD-10-CM

## 2017-10-16 DIAGNOSIS — Z91030 Bee allergy status: Secondary | ICD-10-CM

## 2017-10-16 MED ORDER — EPINEPHRINE 0.3 MG/0.3ML IJ SOAJ: 0.3000 mg | Freq: Once | INTRAMUSCULAR | 4 refills | Status: AC

## 2017-10-16 NOTE — Telephone Encounter (Signed)
Patient is aware Rx has been sent to the pharmacy.

## 2017-10-16 NOTE — Telephone Encounter (Signed)
Epi pen sent to pharm.

## 2017-10-16 NOTE — Addendum Note (Signed)
Addended by: Andrez GrimeKREMER, Ocie Tino A on: 10/16/2017 04:08 PM   Modules accepted: Orders

## 2017-10-16 NOTE — Telephone Encounter (Signed)
Okay to send in? Patient is allergic to bee venom.    Copied from CRM [phone]. Topic: General - Other >> Oct 16, 2017 12:45 PM Mcneil, Ja-Kwan wrote: Reason for CRM: Pt states he needs a Rx refill for EpiPen. Pt request a call back. Cb# [phone]

## 2017-10-25 ENCOUNTER — Other Ambulatory Visit: Payer: Self-pay | Admitting: Family Medicine

## 2017-10-25 DIAGNOSIS — N401 Enlarged prostate with lower urinary tract symptoms: Secondary | ICD-10-CM

## 2017-10-25 DIAGNOSIS — R3912 Poor urinary stream: Principal | ICD-10-CM

## 2017-10-30 ENCOUNTER — Telehealth: Payer: Self-pay

## 2017-10-30 NOTE — Telephone Encounter (Signed)
RTC

## 2017-10-30 NOTE — Telephone Encounter (Signed)
I left a voicemail for patient to call the office back & schedule an appointment.

## 2017-10-30 NOTE — Telephone Encounter (Signed)
Copied from CRM [id number]. Topic: Referral - Request >> Oct 30, 2017  9:35 AM Lenoria ChimeBeasley, Denise S wrote: Reason for CRM: Pt wants a referral to ENT for ear pain that is not getting better. Pt did not specify a MD or location

## 2017-10-31 ENCOUNTER — Encounter: Payer: Self-pay | Admitting: Family Medicine

## 2017-10-31 ENCOUNTER — Ambulatory Visit (INDEPENDENT_AMBULATORY_CARE_PROVIDER_SITE_OTHER): Payer: Medicare Other | Admitting: Family Medicine

## 2017-10-31 VITALS — BP 122/80 | HR 71 | Ht 73.0 in

## 2017-10-31 DIAGNOSIS — H7391 Unspecified disorder of tympanic membrane, right ear: Secondary | ICD-10-CM

## 2017-10-31 NOTE — Telephone Encounter (Signed)
appt made for 1:115 today

## 2017-10-31 NOTE — Progress Notes (Signed)
Subjective:  Patient ID: Daniel Blake, male    DOB: [birth year]  Age: 72 y.o. MRN: [id number]  CC: Follow-up (ear pain )   HPI Daniel Blake presents for patient is following up for right ear discomfort.  He says that the swelling and discomfort he had felt previously did respond to the given drops.  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  He is now having a fluttering sensation in his right ear.  He does have a history of cerumen impaction in his ears have been irrigated several occasions.  Outpatient Medications Prior to Visit  Medication Sig Dispense Refill  . ALPRAZolam (XANAX) 0.25 MG tablet TAKE 1 TABLET BY MOUTH TWICE DAILY AS NEEDED FOR SLEEP OR ANXIETY 60 tablet 0  . aspirin EC 81 MG tablet Take 81 mg by mouth daily.    Marland Kitchen BENICAR 20 MG tablet Take 1 tablet by mouth daily.  0  . calcium-vitamin D (OSCAL WITH D) 500-200 MG-UNIT tablet Take 1 tablet by mouth daily with breakfast.    . cholecalciferol (VITAMIN D) 1000 units tablet Take 1,000 Units by mouth daily.    Marland Kitchen EPINEPHrine 0.3 mg/0.3 mL IJ SOAJ injection INJECT 0.3ML INTO THE MUSCLE ONCE FOR ONE DOSE AS DIRECTED  4  . ezetimibe (ZETIA) 10 MG tablet Take 1 tablet by mouth daily.    . fexofenadine (ALLEGRA) 180 MG tablet Take 180 mg by mouth daily.    Marland Kitchen omeprazole (PRILOSEC) 20 MG capsule Take 20 mg by mouth daily.    Marland Kitchen zinc gluconate 50 MG tablet Take 50 mg by mouth daily.    Daniel Blake Oil 1000 MG CAPS Take 1 capsule by mouth daily.    . magnesium 30 MG tablet Take 30 mg by mouth daily.    . Saw Palmetto 160 MG CAPS Take 450 mg by mouth daily.    . tamsulosin (FLOMAX) 0.4 MG CAPS capsule Take 1 capsule by mouth daily.  3  . tamsulosin (FLOMAX) 0.4 MG CAPS capsule TAKE 1 CAPSULE(0.4 MG) BY MOUTH DAILY 30 capsule 0  . Testosterone 20.25 MG/ACT (1.62%) GEL APPLY 2 SPRAYS ON THE SKIN D  2   No facility-administered medications prior to visit.     ROS Review of Systems    Constitutional: Negative for chills, fever and unexpected weight change.  HENT: Positive for ear pain and hearing loss. Negative for congestion, dental problem, ear discharge, facial swelling, postnasal drip, rhinorrhea and sinus pain.   Eyes: Negative.   Respiratory: Negative.   Cardiovascular: Negative.   Gastrointestinal: Negative.   Musculoskeletal: Negative for arthralgias (neg in tmj).  Skin: Negative.   Neurological: Negative for headaches.  Hematological: Does not bruise/bleed easily.  Psychiatric/Behavioral: Negative.     Objective:  BP 122/80   Pulse 71   Ht 6\' 1"  (1.854 m)   SpO2 97%   BMI 26.68 kg/m   BP Readings from Last 3 Encounters:  10/31/17 122/80  10/09/17 118/80  05/25/17 128/68    Wt Readings from Last 3 Encounters:  10/09/17 202 lb 4 oz (91.7 kg)  05/25/17 201 lb (91.2 kg)  05/19/17 201 lb 8 oz (91.4 kg)    Physical Exam  Constitutional: He is oriented to person, place, and time. He appears well-developed and well-nourished. No distress.  HENT:  Head: Normocephalic and atraumatic.  Right Ear: Tympanic membrane and external ear normal.  Left Ear: Tympanic membrane and external ear  normal.  Ears:  Mouth/Throat: Oropharynx is clear and moist. No oropharyngeal exudate.  Eyes: Pupils are equal, round, and reactive to light. Conjunctivae and EOM are normal.  Neck: Normal range of motion. Neck supple. No JVD present. No tracheal deviation present. No thyromegaly present.  Neurological: He is alert and oriented to person, place, and time.  Skin: Skin is warm and dry. He is not diaphoretic.    Lab Results  Component Value Date   WBC 6.6 04/19/2017   HGB 14.4 04/19/2017   HCT 42.8 04/19/2017   PLT 318.0 04/19/2017   GLUCOSE 95 04/19/2017   CHOL 204 (H) 04/19/2017   TRIG 202.0 (H) 04/19/2017   HDL 46.10 04/19/2017   LDLDIRECT 143.0 04/19/2017   ALT 19 04/19/2017   AST 21 04/19/2017   NA 142 04/19/2017   K 4.9 04/19/2017   CL 104 04/19/2017    CREATININE 0.98 04/19/2017   BUN 17 04/19/2017   CO2 24 04/19/2017   TSH 3.25 04/19/2017   PSA 0.42 04/19/2017    [place] Abdomen Complete  Result Date: 04/21/2017 CLINICAL DATA:  Epigastric pain. EXAM: ABDOMEN ULTRASOUND COMPLETE COMPARISON:  12/08/2014 FINDINGS: Gallbladder: No gallstones or wall thickening visualized. No sonographic Murphy sign noted by sonographer. Common bile duct: Diameter: 3.3 mm Liver: No focal lesion identified. Within normal limits in parenchymal echogenicity. Portal vein is patent on color Doppler imaging with normal direction of blood flow towards the liver. IVC: No abnormality visualized. Pancreas: Visualized portion unremarkable. Spleen: Measures 13.3 cm and has a volume of 367 cc. Right Kidney: Length: 11.2 cm. Small right kidney cysts are identified within the mid and lower pole. The cyst in the midpole has a maximum diameter 1.3 cm and is complicated by internal areas of septation. 1.1 cm cyst within the inferior pole of the right kidney may also contains thin internal areas of septation. Left Kidney: Length: 12.2 cm. Echogenicity within normal limits. No mass or hydronephrosis visualized. Abdominal aorta: No aneurysm visualized. Aortic atherosclerosis noted. Other findings: None. IMPRESSION: 1. No acute findings. 2. Right kidney cysts appear mildly complicated by internal areas of septation. These do not meet criteria for simple cysts. 3. Aortic atherosclerosis. Electronically Signed   By: Signa Kellaylor  Stroud M.D.   On: 04/21/2017 08:51    Assessment & Plan:   Daniel HiddenGary was seen today for follow-up.  Diagnoses and all orders for this visit:  Tympanic membrane disorder, right -     Ambulatory referral to ENT   I have discontinued Daniel HiddenGary Koch's Saw Palmetto, Krill Oil, magnesium, Testosterone, tamsulosin, and tamsulosin. I am also having him maintain his ezetimibe, fexofenadine, aspirin EC, cholecalciferol, calcium-vitamin D, zinc gluconate, omeprazole, ALPRAZolam, BENICAR,  and EPINEPHrine.  No orders of the defined types were placed in this encounter.    Follow-up: No follow-ups on file.  Mliss SaxWilliam Alfred Raegan Sipp, MD

## 2017-11-29 ENCOUNTER — Other Ambulatory Visit: Payer: Self-pay | Admitting: Family Medicine

## 2017-12-09 ENCOUNTER — Other Ambulatory Visit: Payer: Self-pay | Admitting: Family Medicine

## 2017-12-09 DIAGNOSIS — I700 Atherosclerosis of aorta: Secondary | ICD-10-CM

## 2017-12-09 DIAGNOSIS — E7800 Pure hypercholesterolemia, unspecified: Secondary | ICD-10-CM

## 2018-01-20 ENCOUNTER — Other Ambulatory Visit: Payer: Self-pay | Admitting: Family Medicine

## 2018-01-20 DIAGNOSIS — R3912 Poor urinary stream: Principal | ICD-10-CM

## 2018-01-20 DIAGNOSIS — N401 Enlarged prostate with lower urinary tract symptoms: Secondary | ICD-10-CM

## 2018-01-29 ENCOUNTER — Other Ambulatory Visit: Payer: Self-pay | Admitting: Family Medicine

## 2018-01-31 NOTE — Telephone Encounter (Signed)
Patient needs to see me.

## 2018-02-21 ENCOUNTER — Telehealth: Payer: Self-pay

## 2018-02-21 DIAGNOSIS — N281 Cyst of kidney, acquired: Secondary | ICD-10-CM

## 2018-02-21 NOTE — Telephone Encounter (Signed)
Copied from CRM [id number]. Topic: General - Other >> Feb 21, 2018 12:28 PM Lynne LoganHudson, Caryn D wrote: Reason for CRM: Pt stated Dr. Doreene BurkeKremer referred him to have an ultrasound on his Kidneys because he had cysts and he was supposed to have a check up done in a year's time to follow up and make sure there was not any more growth. Pt requesting to have follow up Ultrasound scheduled. Please advise. #CB [phone]

## 2018-02-22 ENCOUNTER — Encounter: Payer: Self-pay | Admitting: Family Medicine

## 2018-02-22 NOTE — Telephone Encounter (Signed)
Mychart message sent to patient letting him know the ultrasound is ordered & to let me know if he does not hear anything by the middle of next week on scheduling it.

## 2018-02-22 NOTE — Addendum Note (Signed)
Addended by: Andrez GrimeKREMER, Tramel Westbrook A on: 02/22/2018 09:32 AM   Modules accepted: Orders

## 2018-02-22 NOTE — Telephone Encounter (Signed)
Please help patient with scheduling ultrasound, thank you guys!

## 2018-02-22 NOTE — Telephone Encounter (Signed)
Us ordered

## 2018-02-23 NOTE — Telephone Encounter (Signed)
Pt scheduled for ultrasound.

## 2018-02-28 ENCOUNTER — Other Ambulatory Visit: Payer: Self-pay | Admitting: Family Medicine

## 2018-02-28 DIAGNOSIS — R3912 Poor urinary stream: Principal | ICD-10-CM

## 2018-02-28 DIAGNOSIS — N401 Enlarged prostate with lower urinary tract symptoms: Secondary | ICD-10-CM

## 2018-03-14 ENCOUNTER — Ambulatory Visit (HOSPITAL_BASED_OUTPATIENT_CLINIC_OR_DEPARTMENT_OTHER)
Admission: RE | Admit: 2018-03-14 | Discharge: 2018-03-14 | Disposition: A | Discharge status: Home / Self Care | Payer: Medicare Other | Source: Ambulatory Visit | Attending: Family Medicine | Admitting: Family Medicine

## 2018-03-14 DIAGNOSIS — N281 Cyst of kidney, acquired: Secondary | ICD-10-CM | POA: Diagnosis not present

## 2018-03-15 ENCOUNTER — Other Ambulatory Visit: Payer: Self-pay | Admitting: Family Medicine

## 2018-03-19 ENCOUNTER — Telehealth: Payer: Self-pay

## 2018-03-19 NOTE — Telephone Encounter (Signed)
Copied from CRM [id number]. Topic: General - Other >> Mar 19, 2018  9:19 AM Ronney LionArrington, Shykila A wrote: Reason for CRM: pt called in wanting to have a nurse disclose his results from his US he recently had.

## 2018-03-20 ENCOUNTER — Encounter: Payer: Self-pay | Admitting: Family Medicine

## 2018-03-21 NOTE — Telephone Encounter (Signed)
Renal us stable.

## 2018-03-21 NOTE — Telephone Encounter (Signed)
Patient made aware of the results yesterday, please see result note.

## 2018-03-27 ENCOUNTER — Telehealth: Payer: Self-pay | Admitting: Family Medicine

## 2018-03-27 LAB — CBC AND DIFFERENTIAL
HCT: 44 (ref 41–53)
HEMOGLOBIN: 15.4 (ref 13.5–17.5)
Platelets: 318 (ref 150–399)
WBC: 5.9

## 2018-03-27 NOTE — Telephone Encounter (Signed)
There is not another med like flomax that he can take that works as quickly and won't affect his eyes. If urgent surgery is required for his eyes, then he will have to stop it.

## 2018-03-27 NOTE — Telephone Encounter (Signed)
I called and spoke with patient. His eye doctor said he needs to be off the medication for at least a year prior to having surgery. His eye doctor mentioned 3-4 newer medications that have come out that can work the same way without having the same side effects. Patient would like to try one of these medications and stop the Flomax so he can schedule the surgery.

## 2018-03-27 NOTE — Telephone Encounter (Signed)
Copied from CRM [phone]. Topic: Quick Communication - See Telephone Encounter >> Mar 27, 2018 10:41 AM Debroah LoopLander, Lumin L wrote: CRM for notification. See Telephone encounter for: 03/27/18. Patient's eye doctor says the patient needs to quit taking tamsulosin (FLOMAX) 0.4 MG CAPS capsule because of the effects it has on the eyes. Please advise because patient does have upcoming eye surgery and it's urgent he stops tamsulosin (FLOMAX) 0.4 MG CAPS capsule  and starts something else that won't affect his eyes.

## 2018-03-27 NOTE — Telephone Encounter (Signed)
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 There are alternatives and these take a while to become effective.  Pt will need to rtc.

## 2018-03-28 NOTE — Telephone Encounter (Signed)
I called and spoke with patient. We went over information below & appointment scheduled for when he is due for follow up.

## 2018-04-17 ENCOUNTER — Other Ambulatory Visit: Payer: Self-pay | Admitting: Family Medicine

## 2018-04-17 DIAGNOSIS — I700 Atherosclerosis of aorta: Secondary | ICD-10-CM

## 2018-04-17 DIAGNOSIS — E7800 Pure hypercholesterolemia, unspecified: Secondary | ICD-10-CM

## 2018-04-23 ENCOUNTER — Other Ambulatory Visit: Payer: Self-pay | Admitting: Family Medicine

## 2018-04-23 DIAGNOSIS — N401 Enlarged prostate with lower urinary tract symptoms: Secondary | ICD-10-CM

## 2018-04-23 DIAGNOSIS — R3912 Poor urinary stream: Principal | ICD-10-CM

## 2018-05-18 ENCOUNTER — Telehealth: Payer: Self-pay | Admitting: Family Medicine

## 2018-05-18 ENCOUNTER — Encounter: Payer: Self-pay | Admitting: Family Medicine

## 2018-05-18 ENCOUNTER — Ambulatory Visit (INDEPENDENT_AMBULATORY_CARE_PROVIDER_SITE_OTHER): Payer: Medicare Other | Admitting: Family Medicine

## 2018-05-18 VITALS — BP 126/80 | HR 67 | Ht 73.0 in | Wt 196.1 lb

## 2018-05-18 DIAGNOSIS — E7800 Pure hypercholesterolemia, unspecified: Secondary | ICD-10-CM

## 2018-05-18 DIAGNOSIS — H6122 Impacted cerumen, left ear: Secondary | ICD-10-CM | POA: Insufficient documentation

## 2018-05-18 DIAGNOSIS — I700 Atherosclerosis of aorta: Secondary | ICD-10-CM

## 2018-05-18 DIAGNOSIS — H6983 Other specified disorders of Eustachian tube, bilateral: Secondary | ICD-10-CM

## 2018-05-18 DIAGNOSIS — E559 Vitamin D deficiency, unspecified: Secondary | ICD-10-CM | POA: Diagnosis not present

## 2018-05-18 DIAGNOSIS — J45909 Unspecified asthma, uncomplicated: Secondary | ICD-10-CM | POA: Insufficient documentation

## 2018-05-18 DIAGNOSIS — F419 Anxiety disorder, unspecified: Secondary | ICD-10-CM

## 2018-05-18 DIAGNOSIS — J4520 Mild intermittent asthma, uncomplicated: Secondary | ICD-10-CM

## 2018-05-18 LAB — LIPID PANEL
Cholesterol: 181 mg/dL (ref 0–200)
HDL: 46.6 mg/dL (ref >39.00)
LDL Cholesterol: 111 mg/dL — ABNORMAL HIGH (ref 0–99)
NONHDL: 134.6
Total CHOL/HDL Ratio: 4
Triglycerides: 117 mg/dL (ref 0.0–149.0)
VLDL: 23.4 mg/dL (ref 0.0–40.0)

## 2018-05-18 LAB — COMPREHENSIVE METABOLIC PANEL
ALT: 15 U/L (ref 0–53)
AST: 17 U/L (ref 0–37)
Albumin: 4.5 g/dL (ref 3.5–5.2)
Alkaline Phosphatase: 49 U/L (ref 39–117)
BUN: 11 mg/dL (ref 6–23)
CO2: 29 mEq/L (ref 19–32)
Calcium: 9.5 mg/dL (ref 8.4–10.5)
Chloride: 104 mEq/L (ref 96–112)
Creatinine, Ser: 1.23 mg/dL (ref 0.40–1.50)
GFR: 57.53 mL/min — ABNORMAL LOW (ref >60.00)
Glucose, Bld: 89 mg/dL (ref 70–99)
Potassium: 4.2 mEq/L (ref 3.5–5.1)
SODIUM: 140 meq/L (ref 135–145)
Total Bilirubin: 0.7 mg/dL (ref 0.2–1.2)
Total Protein: 6.8 g/dL (ref 6.0–8.3)

## 2018-05-18 LAB — VITAMIN D 25 HYDROXY (VIT D DEFICIENCY, FRACTURES): VITD: 50.74 ng/mL (ref 30.00–100.00)

## 2018-05-18 MED ORDER — ALPRAZOLAM 0.25 MG PO TABS: ORAL_TABLET | ORAL | 0 refills | Status: DC

## 2018-05-18 MED ORDER — ALBUTEROL SULFATE 108 (90 BASE) MCG/ACT IN AEPB: 1.0000 | INHALATION_SPRAY | Freq: Four times a day (QID) | RESPIRATORY_TRACT | 1 refills | Status: DC | PRN

## 2018-05-18 NOTE — Patient Instructions (Signed)

## 2018-05-18 NOTE — Progress Notes (Signed)
Established Patient Office Visit  Subjective:  Patient ID: Daniel Blake, male    DOB: 1945/06/03  Age: 73 y.o. MRN: [id number]  CC:  Chief Complaint  Patient presents with  . Follow-up    HPI Daniel Blake presents for follow-up of his elevated cholesterol, anxiety, disc function of eustachian tubes, ceruminosis and exercise-induced asthma.  Patient is no longer taking his Benicar secondary to hypotension.  He maintains a normal blood pressure no longer needs to be medicated.  He is exercising daily treadmill and lifting weights.  He sometimes wheezes when he gets on a treadmill.  He is used albuterol inhalers in the past for this issue with success.  He does not wheeze otherwise.  He averages using about 30 Xanax every 90 days.  He uses it for anxiety and occasionally for sleep.  History of eustachian tube dysfunction.  He is using Nasacort for this intermittently.  He saw [facility] who measured his PSA, testosterone and CBC.  All these levels were normal.  He has follow-up scheduled with urology for his prostate check.  Continues to see cardiology as well.  Encouraged patient to use the Nasacort daily regularly for his eustachian tube dysfunction.  Past Medical History:  Diagnosis Date  . Allergy   . Chicken pox   . GERD (gastroesophageal reflux disease)   . Hyperlipidemia   . Hypertension   . PUD (peptic ulcer disease)     Past Surgical History:  Procedure Laterality Date  . TONSILLECTOMY  1962    Family History  Problem Relation Age of Onset  . Heart disease Mother   . Hyperlipidemia Mother   . Hypertension Mother   . Asthma Father   . COPD Father   . Hyperlipidemia Father   . Hypertension Father   . Stroke Father   . Arthritis Sister   . Early death Sister   . Heart disease Brother   . Heart disease Brother   . Heart attack Brother   . Early death Brother     Social History   Socioeconomic History  . Marital status: Married    Spouse name: Not on file  . Number  of children: Not on file  . Years of education: Not on file  . Highest education level: Not on file  Occupational History  . Not on file  Social Needs  . Financial resource strain: Not on file  . Food insecurity:    Worry: Not on file    Inability: Not on file  . Transportation needs:    Medical: Not on file    Non-medical: Not on file  Tobacco Use  . Smoking status: Former Games developermoker  . Smokeless tobacco: Never Used  Substance and Sexual Activity  . Alcohol use: No    Frequency: Never  . Drug use: No  . Sexual activity: Never  Lifestyle  . Physical activity:    Days per week: Not on file    Minutes per session: Not on file  . Stress: Not on file  Relationships  . Social connections:    Talks on phone: Not on file    Gets together: Not on file    Attends religious service: Not on file    Active member of club or organization: Not on file    Attends meetings of clubs or organizations: Not on file    Relationship status: Not on file  . Intimate partner violence:    Fear of current or ex partner: Not on file  Emotionally abused: Not on file    Physically abused: Not on file    Forced sexual activity: Not on file  Other Topics Concern  . Not on file  Social History Narrative  . Not on file    Outpatient Medications Prior to Visit  Medication Sig Dispense Refill  . aspirin EC 81 MG tablet Take 81 mg by mouth daily.    . calcium-vitamin D (OSCAL WITH D) 500-200 MG-UNIT tablet Take 1 tablet by mouth daily with breakfast.    . cholecalciferol (VITAMIN D) 1000 units tablet Take 1,000 Units by mouth daily.    Marland Kitchen EPINEPHrine 0.3 mg/0.3 mL IJ SOAJ injection INJECT 0.3ML INTO THE MUSCLE ONCE FOR ONE DOSE AS DIRECTED  4  . ezetimibe (ZETIA) 10 MG tablet TAKE 1 TABLET BY MOUTH DAILY 30 tablet 3  . fexofenadine (ALLEGRA) 180 MG tablet Take 180 mg by mouth daily.    Marland Kitchen omeprazole (PRILOSEC) 20 MG capsule Take 20 mg by mouth daily.    . tamsulosin (FLOMAX) 0.4 MG CAPS capsule TAKE 1  CAPSULE(0.4 MG) BY MOUTH DAILY 30 capsule 1  . ALPRAZolam (XANAX) 0.25 MG tablet TAKE 1 TABLET BY MOUTH TWICE DAILY AS NEEDED FOR SLEEP OR ANXIETY 60 tablet 0  . BENICAR 20 MG tablet TAKE 1 TABLET BY MOUTH DAILY EVERY DAY 30 tablet 0  . zinc gluconate 50 MG tablet Take 50 mg by mouth daily.     No facility-administered medications prior to visit.     Allergies  Allergen Reactions  . Bee Venom Anaphylaxis  . Amlodipine Other (See Comments)    Tired , foggy feeling  Tired , foggy feeling    . Pravastatin Other (See Comments)    Myalgia Myalgia   . Rosuvastatin Other (See Comments)    ROS Review of Systems  Constitutional: Negative.   HENT: Negative.   Eyes: Negative.   Respiratory: Negative.   Cardiovascular: Negative.   Gastrointestinal: Negative.   Endocrine: Negative for polyphagia and polyuria.  Genitourinary: Negative for difficulty urinating, frequency and urgency.  Musculoskeletal: Negative for gait problem and joint swelling.  Skin: Negative.   Allergic/Immunologic: Negative for immunocompromised state.  Neurological: Negative for light-headedness and headaches.  Hematological: Negative.   Psychiatric/Behavioral: The patient is nervous/anxious.       Objective:    Physical Exam  Constitutional: He is oriented to person, place, and time. He appears well-developed and well-nourished. No distress.  HENT:  Head: Normocephalic and atraumatic.  Right Ear: External ear normal.  Left Ear: External ear normal.  Mouth/Throat: Oropharynx is clear and moist. No oropharyngeal exudate.  Eyes: Pupils are equal, round, and reactive to light. Conjunctivae are normal. Right eye exhibits no discharge. Left eye exhibits no discharge. No scleral icterus.  Neck: Neck supple. No JVD present. No tracheal deviation present. No thyromegaly present.  Cardiovascular: Normal rate, regular rhythm and normal heart sounds.  Pulmonary/Chest: Effort normal and breath sounds normal. No  stridor.  Abdominal: Bowel sounds are normal.  Musculoskeletal:        General: No edema.  Lymphadenopathy:    He has no cervical adenopathy.  Neurological: He is alert and oriented to person, place, and time.  Skin: Skin is warm and dry. He is not diaphoretic.  Psychiatric: He has a normal mood and affect. His behavior is normal.    BP 126/80   Pulse 67   Ht 6\' 1"  (1.854 m)   Wt 196 lb 2 oz (89 kg)   SpO2 96%  BMI 25.88 kg/m  Wt Readings from Last 3 Encounters:  05/18/18 196 lb 2 oz (89 kg)  10/09/17 202 lb 4 oz (91.7 kg)  05/25/17 201 lb (91.2 kg)   BP Readings from Last 3 Encounters:  05/18/18 126/80  10/31/17 122/80  10/09/17 118/80   Guideline developer:  UpToDate (see UpToDate for funding source) Date Released: June 2014  There are no preventive care reminders to display for this patient.  There are no preventive care reminders to display for this patient.  Lab Results  Component Value Date   TSH 3.25 04/19/2017   Lab Results  Component Value Date   WBC 5.9 03/26/2018   HGB 15.4 03/26/2018   HCT 44 03/26/2018   MCV 95.5 04/19/2017   PLT 318 03/26/2018   Lab Results  Component Value Date   NA 142 04/19/2017   K 4.9 04/19/2017   CO2 24 04/19/2017   GLUCOSE 95 04/19/2017   BUN 17 04/19/2017   CREATININE 0.98 04/19/2017   BILITOT 0.5 04/19/2017   ALKPHOS 41 04/19/2017   AST 21 04/19/2017   ALT 19 04/19/2017   PROT 6.7 04/19/2017   ALBUMIN 4.5 04/19/2017   CALCIUM 9.4 04/19/2017   GFR 79.71 04/19/2017   Lab Results  Component Value Date   CHOL 204 (H) 04/19/2017   Lab Results  Component Value Date   HDL 46.10 04/19/2017   No results found for: [facility] Lab Results  Component Value Date   TRIG 202.0 (H) 04/19/2017   Lab Results  Component Value Date   CHOLHDL 4 04/19/2017   No results found for: HGBA1C    Assessment & Plan:   Problem List Items Addressed This Visit      Cardiovascular and Mediastinum   Atherosclerosis of aorta  (HCC) - Primary   Relevant Orders   Lipid panel   Comprehensive metabolic panel     Respiratory   Reactive airway disease   Relevant Medications   Albuterol Sulfate (PROAIR RESPICLICK) 108 (90 Base) MCG/ACT AEPB     Nervous and Auditory   Dysfunction of both eustachian tubes   Excessive cerumen in left ear canal     Other   Elevated cholesterol   Relevant Orders   Lipid panel   Comprehensive metabolic panel   Anxiety   Relevant Medications   ALPRAZolam (XANAX) 0.25 MG tablet   Vitamin D deficiency   Relevant Orders   VITAMIN D 25 Hydroxy (Vit-D Deficiency, Fractures)      Meds ordered this encounter  Medications  . ALPRAZolam (XANAX) 0.25 MG tablet    Sig: TAKE 1 TABLET BY MOUTH TWICE DAILY AS NEEDED FOR SLEEP OR ANXIETY    Dispense:  60 tablet    Refill:  0  . Albuterol Sulfate (PROAIR RESPICLICK) 108 (90 Base) MCG/ACT AEPB    Sig: Inhale 1 puff into the lungs 4 (four) times daily as needed.    Dispense:  1 each    Refill:  1    Follow-up: Return in about 6 months (around 11/16/2018).   Patient will use Debrox in his left ear canal to soften the wax.  He will attempt to remove it at home with his bulb syringe.  He may follow-up here for assistant with that if needed.  Will use Nasacort regularly for his eustachian tube dysfunction.  Will use albuterol as needed after exercising on the treadmill as needed.

## 2018-05-18 NOTE — Telephone Encounter (Signed)
error 

## 2018-06-12 ENCOUNTER — Encounter: Payer: Self-pay | Admitting: Family Medicine

## 2018-06-20 ENCOUNTER — Other Ambulatory Visit: Payer: Self-pay

## 2018-06-20 ENCOUNTER — Ambulatory Visit (INDEPENDENT_AMBULATORY_CARE_PROVIDER_SITE_OTHER): Payer: Medicare Other | Admitting: Family Medicine

## 2018-06-20 ENCOUNTER — Ambulatory Visit (INDEPENDENT_AMBULATORY_CARE_PROVIDER_SITE_OTHER): Payer: Medicare Other

## 2018-06-20 ENCOUNTER — Encounter: Payer: Self-pay | Admitting: Family Medicine

## 2018-06-20 VITALS — BP 128/80 | HR 69 | Temp 97.6°F | Ht 73.0 in | Wt 203.1 lb

## 2018-06-20 DIAGNOSIS — R9389 Abnormal findings on diagnostic imaging of other specified body structures: Secondary | ICD-10-CM

## 2018-06-20 DIAGNOSIS — J301 Allergic rhinitis due to pollen: Secondary | ICD-10-CM | POA: Diagnosis not present

## 2018-06-20 DIAGNOSIS — R059 Cough, unspecified: Secondary | ICD-10-CM | POA: Insufficient documentation

## 2018-06-20 DIAGNOSIS — H6983 Other specified disorders of Eustachian tube, bilateral: Secondary | ICD-10-CM | POA: Diagnosis not present

## 2018-06-20 DIAGNOSIS — R0981 Nasal congestion: Secondary | ICD-10-CM | POA: Diagnosis not present

## 2018-06-20 DIAGNOSIS — R05 Cough: Secondary | ICD-10-CM | POA: Diagnosis not present

## 2018-06-20 MED ORDER — CETIRIZINE HCL 10 MG PO TABS: 10.0000 mg | ORAL_TABLET | Freq: Every day | ORAL | 11 refills | Status: DC

## 2018-06-20 MED ORDER — PREDNISONE 10 MG (48) PO TBPK: ORAL_TABLET | ORAL | 0 refills | Status: DC

## 2018-06-20 NOTE — Progress Notes (Addendum)
Established Patient Office Visit  Subjective:  Patient ID: Daniel Blake, male    DOB: Aug 18, 1945  Age: 73 y.o. MRN: [id number]  CC:  Chief Complaint  Patient presents with  . chest congestion    HPI Daniel Blake presents for follow-up of ongoing nasal congestion postnasal drip and cough.  Patient has been afebrile and cough is been productive of clear phlegm.  He has been experiencing ear congestion as well with muffled hearing.  He is flushed his ears multiple times.  He has been taking his Nasacort as directed.  He denies wheezing shortness of breath.  Quit smoking 8 months ago.  He has been taking Benadryl at night.  Past Medical History:  Diagnosis Date  . Allergy   . Chicken pox   . GERD (gastroesophageal reflux disease)   . Hyperlipidemia   . Hypertension   . PUD (peptic ulcer disease)     Past Surgical History:  Procedure Laterality Date  . TONSILLECTOMY  1962    Family History  Problem Relation Age of Onset  . Heart disease Mother   . Hyperlipidemia Mother   . Hypertension Mother   . Asthma Father   . COPD Father   . Hyperlipidemia Father   . Hypertension Father   . Stroke Father   . Arthritis Sister   . Early death Sister   . Heart disease Brother   . Heart disease Brother   . Heart attack Brother   . Early death Brother     Social History   Socioeconomic History  . Marital status: Married    Spouse name: Not on file  . Number of children: Not on file  . Years of education: Not on file  . Highest education level: Not on file  Occupational History  . Not on file  Social Needs  . Financial resource strain: Not on file  . Food insecurity:    Worry: Not on file    Inability: Not on file  . Transportation needs:    Medical: Not on file    Non-medical: Not on file  Tobacco Use  . Smoking status: Former Smoker    Types: Cigarettes    Last attempt to quit: 10/19/2017    Years since quitting: 0.6  . Smokeless tobacco: Never Used  Substance and  Sexual Activity  . Alcohol use: No    Frequency: Never  . Drug use: No  . Sexual activity: Never  Lifestyle  . Physical activity:    Days per week: Not on file    Minutes per session: Not on file  . Stress: Not on file  Relationships  . Social connections:    Talks on phone: Not on file    Gets together: Not on file    Attends religious service: Not on file    Active member of club or organization: Not on file    Attends meetings of clubs or organizations: Not on file    Relationship status: Not on file  . Intimate partner violence:    Fear of current or ex partner: Not on file    Emotionally abused: Not on file    Physically abused: Not on file    Forced sexual activity: Not on file  Other Topics Concern  . Not on file  Social History Narrative  . Not on file    Outpatient Medications Prior to Visit  Medication Sig Dispense Refill  . Albuterol Sulfate (PROAIR RESPICLICK) 108 (90 Base) MCG/ACT AEPB Inhale 1 puff  into the lungs 4 (four) times daily as needed. 1 each 1  . ALPRAZolam (XANAX) 0.25 MG tablet TAKE 1 TABLET BY MOUTH TWICE DAILY AS NEEDED FOR SLEEP OR ANXIETY 60 tablet 0  . aspirin EC 81 MG tablet Take 81 mg by mouth daily.    . calcium-vitamin D (OSCAL WITH D) 500-200 MG-UNIT tablet Take 1 tablet by mouth daily with breakfast.    . cholecalciferol (VITAMIN D) 1000 units tablet Take 1,000 Units by mouth daily.    Marland Kitchen EPINEPHrine 0.3 mg/0.3 mL IJ SOAJ injection INJECT 0.3ML INTO THE MUSCLE ONCE FOR ONE DOSE AS DIRECTED  4  . ezetimibe (ZETIA) 10 MG tablet TAKE 1 TABLET BY MOUTH DAILY 30 tablet 3  . fexofenadine (ALLEGRA) 180 MG tablet Take 180 mg by mouth daily.    Marland Kitchen omeprazole (PRILOSEC) 20 MG capsule Take 20 mg by mouth daily.    . tamsulosin (FLOMAX) 0.4 MG CAPS capsule TAKE 1 CAPSULE(0.4 MG) BY MOUTH DAILY 30 capsule 1   No facility-administered medications prior to visit.     Allergies  Allergen Reactions  . Bee Venom Anaphylaxis  . Amlodipine Other (See  Comments)    Tired , foggy feeling  Tired , foggy feeling    . Pravastatin Other (See Comments)    Myalgia Myalgia   . Rosuvastatin Other (See Comments)    ROS Review of Systems  Constitutional: Positive for fatigue. Negative for chills, diaphoresis, fever and unexpected weight change.  HENT: Positive for congestion, postnasal drip and rhinorrhea. Negative for sinus pressure, sinus pain and sore throat.   Eyes: Negative for photophobia and visual disturbance.  Respiratory: Positive for cough. Negative for shortness of breath and wheezing.   Cardiovascular: Negative.   Gastrointestinal: Negative.   Genitourinary: Negative.   Musculoskeletal: Negative for gait problem and joint swelling.  Neurological: Negative for headaches.  Hematological: Does not bruise/bleed easily.  Psychiatric/Behavioral: Negative.       Objective:    Physical Exam  BP 128/80   Pulse 69   Temp 97.6 F (36.4 C) (Oral)   Ht 6\' 1"  (1.854 m)   Wt 203 lb 2 oz (92.1 kg)   SpO2 98%   BMI 26.80 kg/m  Wt Readings from Last 3 Encounters:  06/20/18 203 lb 2 oz (92.1 kg)  05/18/18 196 lb 2 oz (89 kg)  10/09/17 202 lb 4 oz (91.7 kg)   BP Readings from Last 3 Encounters:  06/20/18 128/80  05/18/18 126/80  10/31/17 122/80   Guideline developer:  UpToDate (see UpToDate for funding source) Date Released: June 2014  There are no preventive care reminders to display for this patient.  There are no preventive care reminders to display for this patient.  Lab Results  Component Value Date   TSH 3.25 04/19/2017   Lab Results  Component Value Date   WBC 5.9 03/26/2018   HGB 15.4 03/26/2018   HCT 44 03/26/2018   MCV 95.5 04/19/2017   PLT 318 03/26/2018   Lab Results  Component Value Date   NA 140 05/18/2018   K 4.2 05/18/2018   CO2 29 05/18/2018   GLUCOSE 89 05/18/2018   BUN 11 05/18/2018   CREATININE 1.23 05/18/2018   BILITOT 0.7 05/18/2018   ALKPHOS 49 05/18/2018   AST 17 05/18/2018   ALT  15 05/18/2018   PROT 6.8 05/18/2018   ALBUMIN 4.5 05/18/2018   CALCIUM 9.5 05/18/2018   GFR 57.53 (L) 05/18/2018   Lab Results  Component Value Date  CHOL 181 05/18/2018   Lab Results  Component Value Date   HDL 46.60 05/18/2018   Lab Results  Component Value Date   LDLCALC 111 (H) 05/18/2018   Lab Results  Component Value Date   TRIG 117.0 05/18/2018   Lab Results  Component Value Date   CHOLHDL 4 05/18/2018   No results found for: HGBA1C    Assessment & Plan:   Problem List Items Addressed This Visit      Respiratory   Allergic rhinitis due to pollen - Primary   Relevant Medications   predniSONE (STERAPRED UNI-PAK 48 TAB) 10 MG (48) TBPK tablet   cetirizine (ZYRTEC) 10 MG tablet     Nervous and Auditory   Dysfunction of both eustachian tubes   Relevant Medications   predniSONE (STERAPRED UNI-PAK 48 TAB) 10 MG (48) TBPK tablet   cetirizine (ZYRTEC) 10 MG tablet     Other   Cough   Relevant Orders   DG Chest 2 View (Completed)   Nasal congestion   Relevant Medications   predniSONE (STERAPRED UNI-PAK 48 TAB) 10 MG (48) TBPK tablet   cetirizine (ZYRTEC) 10 MG tablet   Abnormal CXR      Meds ordered this encounter  Medications  . predniSONE (STERAPRED UNI-PAK 48 TAB) 10 MG (48) TBPK tablet    Sig: Pharm to instruct a 12 day dose pack.    Dispense:  48 tablet    Refill:  0  . cetirizine (ZYRTEC) 10 MG tablet    Sig: Take 1 tablet (10 mg total) by mouth daily.    Dispense:  30 tablet    Refill:  11    Follow-up: Return in about 2 months (around 08/20/2018), or if symptoms worsen or fail to improve.    Patient will continue Nasacort and we will add Zyrtec and a 12-day prednisone Dosepak.  Discussed side effects of prednisone.  Will consider ENT referral if he does not improve on above therapy.  We will recheck chest x-ray in 2 months.

## 2018-06-25 DIAGNOSIS — R9389 Abnormal findings on diagnostic imaging of other specified body structures: Secondary | ICD-10-CM | POA: Insufficient documentation

## 2018-06-28 ENCOUNTER — Other Ambulatory Visit: Payer: Self-pay | Admitting: Family Medicine

## 2018-06-28 DIAGNOSIS — N401 Enlarged prostate with lower urinary tract symptoms: Secondary | ICD-10-CM

## 2018-06-28 DIAGNOSIS — R3912 Poor urinary stream: Principal | ICD-10-CM

## 2018-07-17 ENCOUNTER — Encounter: Payer: Self-pay | Admitting: Internal Medicine

## 2018-07-17 ENCOUNTER — Ambulatory Visit (INDEPENDENT_AMBULATORY_CARE_PROVIDER_SITE_OTHER): Payer: Medicare Other | Admitting: Internal Medicine

## 2018-07-17 ENCOUNTER — Other Ambulatory Visit: Payer: Self-pay

## 2018-07-17 VITALS — Wt 195.0 lb

## 2018-07-17 DIAGNOSIS — Z8719 Personal history of other diseases of the digestive system: Secondary | ICD-10-CM

## 2018-07-17 DIAGNOSIS — K222 Esophageal obstruction: Secondary | ICD-10-CM | POA: Diagnosis not present

## 2018-07-17 DIAGNOSIS — Z86010 Personal history of colonic polyps: Secondary | ICD-10-CM

## 2018-07-17 DIAGNOSIS — K2100 Gastro-esophageal reflux disease with esophagitis, without bleeding: Secondary | ICD-10-CM

## 2018-07-17 NOTE — Progress Notes (Signed)
HISTORY OF PRESENT ILLNESS:  Daniel Blake is a 73 y.o. male who was initially evaluated May 25, 2017 to establish his GI care with [place].  Previous GI care elsewhere in [facility].  Upper endoscopy in January 2017 revealed erosive esophagitis and small duodenal ulcers.  Biopsies were negative for Helicobacter pylori.  Also noted to have esophageal stricture.  At the time of his evaluation here he was not taking omeprazole regularly and was experiencing active GI distress felt secondary to reflux disease.  Previous abdominal ultrasound in January 2019 was negative.  Colonoscopy January 2017 with tubular adenomas.  Patient presents today via teleconference (due to coronavirus pandemic) for his annual follow-up as requested.  He tells me that he has been compliant with omeprazole 20 mg daily OTC.  As such, he has had no epigastric discomfort or reflux symptoms.  His dysphasia has resolved.  He denies lower GI complaints.  Review of his blood work from May 18, 2018 finds unremarkable comprehensive metabolic panel.  CBC December 2019 with hemoglobin 15.4.  REVIEW OF SYSTEMS:  All non-GI ROS negative unless otherwise stated in the HPI except for allergies  Past Medical History:  Diagnosis Date  . Allergy   . Chicken pox   . GERD (gastroesophageal reflux disease)   . Hyperlipidemia   . Hypertension   . PUD (peptic ulcer disease)     Past Surgical History:  Procedure Laterality Date  . TONSILLECTOMY  1962    Social History Daniel Blake  reports that he quit smoking about 8 months ago. His smoking use included cigarettes. He has never used smokeless tobacco. He reports that he does not drink alcohol or use drugs.  family history includes Arthritis in his sister; Asthma in his father; COPD in his father; Early death in his brother and sister; Heart attack in his brother; Heart disease in his brother, brother, and mother; Hyperlipidemia in his father and mother; Hypertension in his father and  mother; Stroke in his father.  Allergies  Allergen Reactions  . Bee Venom Anaphylaxis  . Amlodipine Other (See Comments)    Tired , foggy feeling  Tired , foggy feeling    . Pravastatin Other (See Comments)    Myalgia Myalgia   . Rosuvastatin Other (See Comments)       PHYSICAL EXAMINATION:  Teleconference.  Thus no physical exam   ASSESSMENT:  1.  GERD with esophagitis and peptic stricture.  Currently asymptomatic on omeprazole 20 mg daily 2.  History of small duodenal ulcers.  Testing for Helicobacter pylori was negative 3.  History of adenomatous colon polyps on colonoscopy 2017 in [place]   PLAN:  1.  Continue reflux precautions 2.  Continue omeprazole 20 mg OTC daily 3.  Routine office follow-up 1 year.  Contact the office in the interim for any questions or problems 4.  Plan on surveillance colonoscopy around January 2022  For this teleconference the patient was in his home and I was in my office.  Patient initiated and consented to this visit which lasted approximately 10 minutes.

## 2018-08-25 ENCOUNTER — Other Ambulatory Visit: Payer: Self-pay | Admitting: Family Medicine

## 2018-08-25 DIAGNOSIS — I700 Atherosclerosis of aorta: Secondary | ICD-10-CM

## 2018-08-25 DIAGNOSIS — N401 Enlarged prostate with lower urinary tract symptoms: Secondary | ICD-10-CM

## 2018-08-25 DIAGNOSIS — E7800 Pure hypercholesterolemia, unspecified: Secondary | ICD-10-CM

## 2018-09-27 ENCOUNTER — Telehealth: Payer: Self-pay | Admitting: Family Medicine

## 2018-09-27 NOTE — Telephone Encounter (Signed)
I tried to call but had to leave message with pts wife to have him call back

## 2018-09-27 NOTE — Telephone Encounter (Signed)
Can we get him scheduled? Thank you!

## 2018-09-27 NOTE — Telephone Encounter (Signed)
Needs ov

## 2018-09-27 NOTE — Telephone Encounter (Signed)
Medication Refill - Medication:  tamsulosin (FLOMAX) 0.4 MG CAPS capsule  Pt states he just returned from his eye doctor who is not happy that he is on this medication  States this medication can cause damage to the pupil and pt is already has cataracts and is looking at possible surgery. Pt would like to be put on a "sister drug" that doesn't have the same side effects.  Pt uses  [facility] [place], [place] [address] (Phone) [phone] (Fax)

## 2018-09-28 ENCOUNTER — Ambulatory Visit: Payer: Medicare Other | Admitting: Family Medicine

## 2018-10-01 ENCOUNTER — Telehealth: Payer: Self-pay

## 2018-10-01 NOTE — Telephone Encounter (Signed)

## 2018-10-02 ENCOUNTER — Ambulatory Visit (INDEPENDENT_AMBULATORY_CARE_PROVIDER_SITE_OTHER): Payer: Medicare Other | Admitting: Family Medicine

## 2018-10-02 ENCOUNTER — Encounter: Payer: Self-pay | Admitting: Family Medicine

## 2018-10-02 VITALS — BP 124/70 | HR 67 | Ht 73.0 in | Wt 200.2 lb

## 2018-10-02 DIAGNOSIS — N183 Chronic kidney disease, stage 3 unspecified: Secondary | ICD-10-CM

## 2018-10-02 DIAGNOSIS — E291 Testicular hypofunction: Secondary | ICD-10-CM

## 2018-10-02 DIAGNOSIS — H259 Unspecified age-related cataract: Secondary | ICD-10-CM | POA: Insufficient documentation

## 2018-10-02 DIAGNOSIS — F419 Anxiety disorder, unspecified: Secondary | ICD-10-CM | POA: Diagnosis not present

## 2018-10-02 DIAGNOSIS — R3912 Poor urinary stream: Secondary | ICD-10-CM

## 2018-10-02 DIAGNOSIS — N401 Enlarged prostate with lower urinary tract symptoms: Secondary | ICD-10-CM | POA: Diagnosis not present

## 2018-10-02 LAB — CBC
HCT: 46.2 % (ref 39.0–52.0)
Hemoglobin: 15.6 g/dL (ref 13.0–17.0)
MCHC: 33.8 g/dL (ref 30.0–36.0)
MCV: 95.7 fl (ref 78.0–100.0)
Platelets: 305 10*3/uL (ref 150.0–400.0)
RBC: 4.82 Mil/uL (ref 4.22–5.81)
RDW: 13.3 % (ref 11.5–15.5)
WBC: 6 10*3/uL (ref 4.0–10.5)

## 2018-10-02 LAB — BASIC METABOLIC PANEL
BUN: 12 mg/dL (ref 6–23)
CO2: 27 mEq/L (ref 19–32)
Calcium: 9.1 mg/dL (ref 8.4–10.5)
Chloride: 104 mEq/L (ref 96–112)
Creatinine, Ser: 1.04 mg/dL (ref 0.40–1.50)
GFR: 69.75 mL/min (ref >60.00)
Glucose, Bld: 91 mg/dL (ref 70–99)
Potassium: 4.7 mEq/L (ref 3.5–5.1)
Sodium: 139 mEq/L (ref 135–145)

## 2018-10-02 LAB — TESTOSTERONE: Testosterone: 248.69 ng/dL — ABNORMAL LOW (ref 300.00–890.00)

## 2018-10-02 LAB — PSA: PSA: 0.42 ng/mL (ref 0.10–4.00)

## 2018-10-02 MED ORDER — ALFUZOSIN HCL ER 10 MG PO TB24: 10.0000 mg | ORAL_TABLET | Freq: Every day | ORAL | 0 refills | Status: DC

## 2018-10-02 MED ORDER — ALPRAZOLAM 0.25 MG PO TABS: ORAL_TABLET | ORAL | 0 refills | Status: DC

## 2018-10-02 NOTE — Progress Notes (Signed)
Established Patient Office Visit  Subjective:  Patient ID: Daniel LatusGary Blake, male    DOB: 21-Nov-1945  Age: 73 y.o. MRN: [id number]  CC:  Chief Complaint  Patient presents with  . Follow-up    HPI Daniel Blake presents for follow-up of his BPH.  Tamsulosin is working well for him but he is in need of cataract extraction and the ophthalmologist is recommended that he change to a nonselective alpha-blocker for treatment.  Tamsulosin has worked well for him.  Urine flow is good and nocturia is no more than once nightly.  Prostate was enlarged on last exam a year ago and nontender.  PSA was normal.  He is taking testosterone gel for androgen deficiency.  He is seeing a endocrinologist for his testosterone gel.  Requests a refill on the alprazolam.  He continues to use it sparingly.  Past Medical History:  Diagnosis Date  . Allergy   . Chicken pox   . GERD (gastroesophageal reflux disease)   . Hyperlipidemia   . Hypertension   . PUD (peptic ulcer disease)     Past Surgical History:  Procedure Laterality Date  . TONSILLECTOMY  1962    Family History  Problem Relation Age of Onset  . Heart disease Mother   . Hyperlipidemia Mother   . Hypertension Mother   . Asthma Father   . COPD Father   . Hyperlipidemia Father   . Hypertension Father   . Stroke Father   . Arthritis Sister   . Early death Sister   . Heart disease Brother   . Heart disease Brother   . Heart attack Brother   . Early death Brother     Social History   Socioeconomic History  . Marital status: Married    Spouse name: Not on file  . Number of children: Not on file  . Years of education: Not on file  . Highest education level: Not on file  Occupational History  . Not on file  Social Needs  . Financial resource strain: Not on file  . Food insecurity    Worry: Not on file    Inability: Not on file  . Transportation needs    Medical: Not on file    Non-medical: Not on file  Tobacco Use  . Smoking status:  Former Smoker    Types: Cigarettes    Quit date: 10/19/2017    Years since quitting: 0.9  . Smokeless tobacco: Never Used  Substance and Sexual Activity  . Alcohol use: No    Frequency: Never  . Drug use: No  . Sexual activity: Never  Lifestyle  . Physical activity    Days per week: Not on file    Minutes per session: Not on file  . Stress: Not on file  Relationships  . Social Musicianconnections    Talks on phone: Not on file    Gets together: Not on file    Attends religious service: Not on file    Active member of club or organization: Not on file    Attends meetings of clubs or organizations: Not on file    Relationship status: Not on file  . Intimate partner violence    Fear of current or ex partner: Not on file    Emotionally abused: Not on file    Physically abused: Not on file    Forced sexual activity: Not on file  Other Topics Concern  . Not on file  Social History Narrative  . Not on file  Outpatient Medications Prior to Visit  Medication Sig Dispense Refill  . Albuterol Sulfate (PROAIR RESPICLICK) 295 (90 Base) MCG/ACT AEPB Inhale 1 puff into the lungs 4 (four) times daily as needed. 1 each 1  . aspirin EC 81 MG tablet Take 81 mg by mouth daily.    . calcium-vitamin D (OSCAL WITH D) 500-200 MG-UNIT tablet Take 1 tablet by mouth daily with breakfast.    . cetirizine (ZYRTEC) 10 MG tablet Take 1 tablet (10 mg total) by mouth daily. 30 tablet 11  . cholecalciferol (VITAMIN D) 1000 units tablet Take 1,000 Units by mouth daily.    Marland Kitchen EPINEPHrine 0.3 mg/0.3 mL IJ SOAJ injection INJECT 0.3ML INTO THE MUSCLE ONCE FOR ONE DOSE AS DIRECTED  4  . ezetimibe (ZETIA) 10 MG tablet TAKE 1 TABLET BY MOUTH DAILY 30 tablet 3  . fexofenadine (ALLEGRA) 180 MG tablet Take 180 mg by mouth daily.    . Testosterone 20.25 MG/1.25GM (1.62%) GEL Apply 1 spray on skin daily.    Marland Kitchen ALPRAZolam (XANAX) 0.25 MG tablet TAKE 1 TABLET BY MOUTH TWICE DAILY AS NEEDED FOR SLEEP OR ANXIETY 60 tablet 0  .  tamsulosin (FLOMAX) 0.4 MG CAPS capsule TAKE 1 CAPSULE(0.4 MG) BY MOUTH DAILY 30 capsule 3   No facility-administered medications prior to visit.     Allergies  Allergen Reactions  . Bee Venom Anaphylaxis  . Amlodipine Other (See Comments)    Tired , foggy feeling  Tired , foggy feeling    . Pravastatin Other (See Comments)    Myalgia Myalgia   . Rosuvastatin Other (See Comments)    ROS Review of Systems  Constitutional: Negative.   Respiratory: Negative.   Cardiovascular: Negative.   Gastrointestinal: Negative.   Genitourinary: Negative for difficulty urinating, hematuria and urgency.  Musculoskeletal: Negative for gait problem and joint swelling.  Skin: Negative for pallor and rash.  Allergic/Immunologic: Negative for immunocompromised state.  Neurological: Negative for light-headedness and numbness.  Hematological: Does not bruise/bleed easily.  Psychiatric/Behavioral: Negative.       Objective:    Physical Exam  Constitutional: He is oriented to person, place, and time. He appears well-developed and well-nourished. No distress.  HENT:  Head: Normocephalic and atraumatic.  Right Ear: External ear normal.  Left Ear: External ear normal.  Eyes: Conjunctivae are normal. Right eye exhibits no discharge. Left eye exhibits no discharge. No scleral icterus.  Neck: No JVD present. No tracheal deviation present.  Pulmonary/Chest: Effort normal. No stridor.  Genitourinary: Rectum:     Guaiac result negative.     External hemorrhoid present.     No rectal mass, anal fissure, tenderness, internal hemorrhoid or abnormal anal tone.  Prostate is enlarged. Prostate is not tender.  Neurological: He is alert and oriented to person, place, and time.  Skin: He is not diaphoretic.  Psychiatric: He has a normal mood and affect. His behavior is normal.    BP 124/70   Pulse 67   Ht 6\' 1"  (1.854 m)   Wt 200 lb 4 oz (90.8 kg)   SpO2 95%   BMI 26.42 kg/m  Wt Readings from Last  3 Encounters:  10/02/18 200 lb 4 oz (90.8 kg)  07/17/18 195 lb (88.5 kg)  06/20/18 203 lb 2 oz (92.1 kg)   BP Readings from Last 3 Encounters:  10/02/18 124/70  06/20/18 128/80  05/18/18 126/80   Guideline developer:  UpToDate (see UpToDate for funding source) Date Released: June 2014  There are no preventive care  reminders to display for this patient.  There are no preventive care reminders to display for this patient.  Lab Results  Component Value Date   TSH 3.25 04/19/2017   Lab Results  Component Value Date   WBC 5.9 03/26/2018   HGB 15.4 03/26/2018   HCT 44 03/26/2018   MCV 95.5 04/19/2017   PLT 318 03/26/2018   Lab Results  Component Value Date   NA 140 05/18/2018   K 4.2 05/18/2018   CO2 29 05/18/2018   GLUCOSE 89 05/18/2018   BUN 11 05/18/2018   CREATININE 1.23 05/18/2018   BILITOT 0.7 05/18/2018   ALKPHOS 49 05/18/2018   AST 17 05/18/2018   ALT 15 05/18/2018   PROT 6.8 05/18/2018   ALBUMIN 4.5 05/18/2018   CALCIUM 9.5 05/18/2018   GFR 57.53 (L) 05/18/2018   Lab Results  Component Value Date   CHOL 181 05/18/2018   Lab Results  Component Value Date   HDL 46.60 05/18/2018   Lab Results  Component Value Date   LDLCALC 111 (H) 05/18/2018   Lab Results  Component Value Date   TRIG 117.0 05/18/2018   Lab Results  Component Value Date   CHOLHDL 4 05/18/2018   No results found for: HGBA1C    Assessment & Plan:   Problem List Items Addressed This Visit      Other   Benign prostatic hyperplasia with weak urinary stream   Relevant Medications   alfuzosin (UROXATRAL) 10 MG 24 hr tablet   Other Relevant Orders   PSA   Anxiety - Primary   Relevant Medications   ALPRAZolam (XANAX) 0.25 MG tablet   Age-related cataract    Other Visit Diagnoses    CKD (chronic kidney disease) stage 3, GFR 30-59 ml/min (HCC)       Relevant Orders   Basic metabolic panel   CBC      Meds ordered this encounter  Medications  . alfuzosin (UROXATRAL) 10  MG 24 hr tablet    Sig: Take 1 tablet (10 mg total) by mouth daily with breakfast.    Dispense:  90 tablet    Refill:  0  . ALPRAZolam (XANAX) 0.25 MG tablet    Sig: TAKE 1 TABLET BY MOUTH TWICE DAILY AS NEEDED FOR SLEEP OR ANXIETY    Dispense:  60 tablet    Refill:  0    Follow-up: Return in about 3 months (around 01/02/2019).   Follow-up of CKD.  Recommended increased hydration and good blood pressure control.  Warned him that he may see a decrease in his blood pressure with a nonselective Uroxatrol.  He will monitor.  Refill on Xanax to use sparingly.  Information on Uroxatrol was given to the patient.

## 2018-10-02 NOTE — Patient Instructions (Signed)
Alfuzosin extended-release tablets What is this medicine? ALFUZOSIN (al FYOO zoe sin) is used to treat benign prostatic hyperplasia (BPH) in men. This is a condition that causes you to have an enlarged prostate. This medicine relaxes the muscles in the prostate and the bladder which reduces symptoms and improves urine flow. This medicine is not for use in women. This medicine may be used for other purposes; ask your health care provider or pharmacist if you have questions. COMMON BRAND NAME(S): Uroxatral What should I tell my health care provider before I take this medicine? They need to know if you have any of the following conditions: -heart disease -kidney disease -liver disease -low blood pressure -an unusual or allergic reaction to alfuzosin, other medicines, foods, dyes, or preservatives How should I use this medicine? Take this medicine by mouth with a glass of water. Follow the directions on the prescription label. Take this medicine after the same meal every day. Take this medicine with food. Do not take on an empty stomach. Swallow whole. Do not cut, crush or chew this medicine. Take your doses at regular intervals. Do not take your medicine more often than directed. Do not stop taking except on the advice of your doctor or health care professional. Talk to your pediatrician regarding the use of this medicine in children. This medicine is not approved for use in children. Overdosage: If you think you have taken too much of this medicine contact a poison control center or emergency room at once. NOTE: This medicine is only for you. Do not share this medicine with others. What if I miss a dose? If you miss a dose, take it as soon as you can. If it is almost time for your next dose, take only that dose. Do not take double or extra doses. What may interact with this medicine? Do not take this medicine with any of the following medications: -another alpha blocker medicine, such as  doxazosin, prazosin, silodosin, tamsulosin, terazosin -certain medicines for fungal infections like fluconazole, itraconazole, ketoconazole, posaconazole, voriconazole -cisapride -dofetilide -dronedarone -droperidol -pimozide -ritonavir -thioridazine -ziprasidone This medicine may also interact with the following medications: -avanafil -cimetidine -certain medicines for chest pain or blood pressure -grapefruit juice -other medicines that prolong the QT interval (cause an abnormal heart rhythm) -sildenafil -tadalafil -vardenafil This list may not describe all possible interactions. Give your health care provider a list of all the medicines, herbs, non-prescription drugs, or dietary supplements you use. Also tell them if you smoke, drink alcohol, or use illegal drugs. Some items may interact with your medicine. What should I watch for while using this medicine? Visit your doctor or health care professional for regular checks on your progress. Check your blood pressure regularly. Ask your doctor or health care professional what your blood pressure should be and when you should contact him or her. Drowsiness and dizziness are more likely to occur after the first dose, after an increase in dose, or during hot weather or exercise. These effects can decrease once your body adjusts to this medicine. Do not drive, use machinery, or do anything that needs mental alertness until you know how this drug affects you. Do not stand or sit up quickly, especially if you are an older patient. This reduces the risk of dizzy or fainting spells. Alcohol can make you more drowsy and dizzy. Avoid alcoholic drinks. Contact your doctor or health care professional right away if you have an erection that lasts longer than 4 hours or if it becomes painful.  This may be a sign of a serious problem and must be treated right away to prevent permanent damage. What side effects may I notice from receiving this medicine? Side  effects that you should report to your doctor or health care professional as soon as possible: -allergic reactions like skin rash, itching or hives, swelling of the face, lips, or tongue -breathing problems -chest pain -fast, irregular heartbeat -feeling faint or lightheaded, falls -prolonged or painful erection -redness, blistering, peeling or loosening of the skin, including inside the mouth -swelling of ankles or legs -unusually weak or tired -yellowing of eyes or skin Side effects that usually do not require medical attention (report to your doctor or health care professional if they continue or are bothersome): -dizziness -headache -tiredness This list may not describe all possible side effects. Call your doctor for medical advice about side effects. You may report side effects to FDA at 1-800-FDA-1088. Where should I keep my medicine? Keep out of the reach of children. Store at room temperature between 15 and 30 degrees C (59 and 86 degrees F). Protect from light and moisture. Throw away any unused medicine after the expiration date. NOTE: This sheet is a summary. It may not cover all possible information. If you have questions about this medicine, talk to your doctor, pharmacist, or health care provider.  2019 Elsevier/Gold Standard (2016-08-31 14:56:32)  Benign Prostatic Hyperplasia  Benign prostatic hyperplasia (BPH) is an enlarged prostate gland that is caused by the normal aging process and not by cancer. The prostate is a walnut-sized gland that is involved in the production of semen. It is located in front of the rectum and below the bladder. The bladder stores urine and the urethra is the tube that carries the urine out of the body. The prostate may get bigger as a man gets older. An enlarged prostate can press on the urethra. This can make it harder to pass urine. The build-up of urine in the bladder can cause infection. Back pressure and infection may progress to bladder  damage and kidney (renal) failure. What are the causes? This condition is part of a normal aging process. However, not all men develop problems from this condition. If the prostate enlarges away from the urethra, urine flow will not be blocked. If it enlarges toward the urethra and compresses it, there will be problems passing urine. What increases the risk? This condition is more likely to develop in men over the age of 79 years. What are the signs or symptoms? Symptoms of this condition include:  Getting up often during the night to urinate.  Needing to urinate frequently during the day.  Difficulty starting urine flow.  Decrease in size and strength of your urine stream.  Leaking (dribbling) after urinating.  Inability to pass urine. This needs immediate treatment.  Inability to completely empty your bladder.  Pain when you pass urine. This is more common if there is also an infection.  Urinary tract infection (UTI). How is this diagnosed? This condition is diagnosed based on your medical history, a physical exam, and your symptoms. Tests will also be done, such as:  A post-void bladder scan. This measures any amount of urine that may remain in your bladder after you finish urinating.  A digital rectal exam. In a rectal exam, your health care provider checks your prostate by putting a lubricated, gloved finger into your rectum to feel the back of your prostate gland. This exam detects the size of your gland and any abnormal  lumps or growths.  An exam of your urine (urinalysis).  A prostate specific antigen (PSA) screening. This is a blood test used to screen for prostate cancer.  An ultrasound. This test uses sound waves to electronically produce a picture of your prostate gland. Your health care provider may refer you to a specialist in kidney and prostate diseases (urologist). How is this treated? Once symptoms begin, your health care provider will monitor your condition  (active surveillance or watchful waiting). Treatment for this condition will depend on the severity of your condition. Treatment may include:  Observation and yearly exams. This may be the only treatment needed if your condition and symptoms are mild.  Medicines to relieve your symptoms, including: ? Medicines to shrink the prostate. ? Medicines to relax the muscle of the prostate.  Surgery in severe cases. Surgery may include: ? Prostatectomy. In this procedure, the prostate tissue is removed completely through an open incision or with a laparascope or robotics. ? Transurethral resection of the prostate (TURP). In this procedure, a tool is inserted through the opening at the tip of the penis (urethra). It is used to cut away tissue of the inner core of the prostate. The pieces are removed through the same opening of the penis. This removes the blockage. ? Transurethral incision (TUIP). In this procedure, small cuts are made in the prostate. This lessens the prostate's pressure on the urethra. ? Transurethral microwave thermotherapy (TUMT). This procedure uses microwaves to create heat. The heat destroys and removes a small amount of prostate tissue. ? Transurethral needle ablation (TUNA). This procedure uses radio frequencies to destroy and remove a small amount of prostate tissue. ? Interstitial laser coagulation (ILC). This procedure uses a laser to destroy and remove a small amount of prostate tissue. ? Transurethral electrovaporization (TUVP). This procedure uses electrodes to destroy and remove a small amount of prostate tissue. ? Prostatic urethral lift. This procedure inserts an implant to push the lobes of the prostate away from the urethra. Follow these instructions at home:  Take over-the-counter and prescription medicines only as told by your health care provider.  Monitor your symptoms for any changes. Contact your health care provider with any changes.  Avoid drinking large  amounts of liquid before going to bed or out in public.  Avoid or reduce how much caffeine or alcohol you drink.  Give yourself time when you urinate.  Keep all follow-up visits as told by your health care provider. This is important. Contact a health care provider if:  You have unexplained back pain.  Your symptoms do not get better with treatment.  You develop side effects from the medicine you are taking.  Your urine becomes very dark or has a bad smell.  Your lower abdomen becomes distended and you have trouble passing your urine. Get help right away if:  You have a fever or chills.  You suddenly cannot urinate.  You feel lightheaded, or very dizzy, or you faint.  There are large amounts of blood or clots in the urine.  Your urinary problems become hard to manage.  You develop moderate to severe low back or flank pain. The flank is the side of your body between the ribs and the hip. These symptoms may represent a serious problem that is an emergency. Do not wait to see if the symptoms will go away. Get medical help right away. Call your local emergency services (911 in the [place] Do not drive yourself to the hospital. Summary  Benign  prostatic hyperplasia (BPH) is an enlarged prostate that is caused by the normal aging process and not by cancer.  An enlarged prostate can press on the urethra. This can make it hard to pass urine.  This condition is part of a normal aging process and is more likely to develop in men over the age of 50 years.  Get help right away if you suddenly cannot urinate. This information is not intended to replace advice given to you by your health care provider. Make sure you discuss any questions you have with your health care provider. Document Released: 03/28/2005 Document Revised: 05/02/2016 Document Reviewed: 05/02/2016 Elsevier Interactive Patient Education  2019 [employer]

## 2018-10-15 ENCOUNTER — Telehealth: Payer: Self-pay

## 2018-10-15 DIAGNOSIS — N401 Enlarged prostate with lower urinary tract symptoms: Secondary | ICD-10-CM

## 2018-10-15 MED ORDER — ALFUZOSIN HCL ER 10 MG PO TB24: 10.0000 mg | ORAL_TABLET | Freq: Every day | ORAL | 0 refills | Status: DC

## 2018-10-15 MED ORDER — TAMSULOSIN HCL 0.4 MG PO CAPS: 0.4000 mg | ORAL_CAPSULE | Freq: Every day | ORAL | 2 refills | Status: DC

## 2018-10-15 NOTE — Telephone Encounter (Signed)
  Copied from [place] [id number]. Topic: General - Other >> Oct 15, 2018  9:36 AM Jeri Cos wrote: Reason for CRM: Pt called stating that the current medication he's taking tamsulosin (FLOMAX) 0.4 MG CAPS capsule [[id number]] has given him some very bad side effects and he is going to stop taking it today. Pt also stated that he is scheduled for surgery some time this winter and feels that this medication is going to harm him in some way prior to that surgery. Pt is going to start taking alfuzosin (UROXATRAL) 10 MG 24 hr tablet tonight as he had some left over from before. He would like Dr. Ethelene Hal to call in a prescription for this medicine.

## 2018-10-15 NOTE — Telephone Encounter (Signed)
I spoke with pt. He does not want to keep taking the Alfuzosin. It is not helping him at all. He wants to go back on the Flomax. Spoke with Dr. Ethelene Hal. Okay to refill Flomax & cancel Alfuzosin.

## 2018-11-16 ENCOUNTER — Ambulatory Visit: Payer: Medicare Other | Admitting: Family Medicine

## 2018-11-19 ENCOUNTER — Ambulatory Visit: Payer: Medicare Other | Admitting: Family Medicine

## 2018-12-27 ENCOUNTER — Other Ambulatory Visit: Payer: Self-pay | Admitting: Family Medicine

## 2018-12-27 DIAGNOSIS — I700 Atherosclerosis of aorta: Secondary | ICD-10-CM

## 2018-12-27 DIAGNOSIS — E7800 Pure hypercholesterolemia, unspecified: Secondary | ICD-10-CM

## 2019-02-26 ENCOUNTER — Other Ambulatory Visit: Payer: Self-pay

## 2019-02-26 DIAGNOSIS — F419 Anxiety disorder, unspecified: Secondary | ICD-10-CM

## 2019-02-26 MED ORDER — ALPRAZOLAM 0.25 MG PO TABS: ORAL_TABLET | ORAL | 0 refills | Status: DC

## 2019-04-30 ENCOUNTER — Telehealth: Payer: Medicare Other | Admitting: Family Medicine

## 2019-06-20 ENCOUNTER — Telehealth: Payer: Self-pay | Admitting: Family Medicine

## 2019-06-20 NOTE — Telephone Encounter (Signed)
Left message for patient to schedule Annual Wellness Visit.  Please schedule with Nurse Health Advisor Victoria Britt, RN at [facility]  

## 2019-06-25 ENCOUNTER — Other Ambulatory Visit: Payer: Self-pay | Admitting: Family Medicine

## 2019-06-25 DIAGNOSIS — E7800 Pure hypercholesterolemia, unspecified: Secondary | ICD-10-CM

## 2019-06-25 DIAGNOSIS — F419 Anxiety disorder, unspecified: Secondary | ICD-10-CM

## 2019-06-25 DIAGNOSIS — I700 Atherosclerosis of aorta: Secondary | ICD-10-CM

## 2019-06-26 ENCOUNTER — Other Ambulatory Visit: Payer: Self-pay

## 2019-06-26 NOTE — Telephone Encounter (Signed)
Requesting: Alprazolam Contract: None UDS: None Last OV: 6.23.20 Next OV: 4.1.21 Last Refill: 11.17.20   Please advise

## 2019-06-27 ENCOUNTER — Encounter: Payer: Self-pay | Admitting: Family Medicine

## 2019-06-27 ENCOUNTER — Ambulatory Visit (INDEPENDENT_AMBULATORY_CARE_PROVIDER_SITE_OTHER): Payer: Medicare Other

## 2019-06-27 ENCOUNTER — Ambulatory Visit (INDEPENDENT_AMBULATORY_CARE_PROVIDER_SITE_OTHER): Payer: Medicare Other | Admitting: Family Medicine

## 2019-06-27 VITALS — BP 124/80 | HR 67 | Temp 96.0°F | Ht 73.0 in | Wt 200.0 lb

## 2019-06-27 DIAGNOSIS — R9389 Abnormal findings on diagnostic imaging of other specified body structures: Secondary | ICD-10-CM

## 2019-06-27 DIAGNOSIS — R399 Unspecified symptoms and signs involving the genitourinary system: Secondary | ICD-10-CM

## 2019-06-27 DIAGNOSIS — R42 Dizziness and giddiness: Secondary | ICD-10-CM | POA: Diagnosis not present

## 2019-06-27 LAB — POCT URINALYSIS DIPSTICK
Bilirubin, UA: NEGATIVE
Blood, UA: NEGATIVE
Glucose, UA: NEGATIVE
Ketones, UA: NEGATIVE
Leukocytes, UA: NEGATIVE
Nitrite, UA: NEGATIVE
Protein, UA: NEGATIVE
Spec Grav, UA: 1.02 (ref 1.010–1.025)
Urobilinogen, UA: 0.2 E.U./dL
pH, UA: 6 (ref 5.0–8.0)

## 2019-06-27 NOTE — Progress Notes (Signed)
Established Patient Office Visit  Subjective:  Patient ID: Daniel Blake, male    DOB: 08-Jan-1946  Age: 74 y.o. MRN: [id number]  CC:  Chief Complaint  Patient presents with  . Acute Visit    c/o bad odor in urine x 3 weeks, dizzy, fatigue, BP going up and down.     HPI Daniel Blake presents for follow-up of an episode of lightheadedness at his home.  He had been on his treadmill and experienced this lightheadedness after walking for 2 miles.  He went and laid down.  Blood pressure with his home cuff was 90/60 but then increased to 150/90 a little while later.  He has had an episode of foul-smelling urine.  He denies dysuria increasing frequency or urgency.  History of BPH.  Denies increased and nocturia.  Small change in prevoid dribble.  Denies any significant change in force of stream.  He is no longer taking the Benicar.  Tamsulosin has been controlling his blood pressure well.  Past Medical History:  Diagnosis Date  . Allergy   . Chicken pox   . GERD (gastroesophageal reflux disease)   . Hyperlipidemia   . Hypertension   . PUD (peptic ulcer disease)     Past Surgical History:  Procedure Laterality Date  . TONSILLECTOMY  1962    Family History  Problem Relation Age of Onset  . Heart disease Mother   . Hyperlipidemia Mother   . Hypertension Mother   . Asthma Father   . COPD Father   . Hyperlipidemia Father   . Hypertension Father   . Stroke Father   . Arthritis Sister   . Early death Sister   . Heart disease Brother   . Heart disease Brother   . Heart attack Brother   . Early death Brother     Social History   Socioeconomic History  . Marital status: Married    Spouse name: Not on file  . Number of children: Not on file  . Years of education: Not on file  . Highest education level: Not on file  Occupational History  . Not on file  Tobacco Use  . Smoking status: Former Smoker    Types: Cigarettes    Quit date: 10/19/2017    Years since quitting: 1.6  .  Smokeless tobacco: Never Used  Substance and Sexual Activity  . Alcohol use: No  . Drug use: No  . Sexual activity: Never  Other Topics Concern  . Not on file  Social History Narrative  . Not on file   Social Determinants of Health   Financial Resource Strain:   . Difficulty of Paying Living Expenses:   Food Insecurity:   . Worried About Charity fundraiser in the Last Year:   . Arboriculturist in the Last Year:   Transportation Needs:   . Film/video editor (Medical):   Marland Kitchen Lack of Transportation (Non-Medical):   Physical Activity:   . Days of Exercise per Week:   . Minutes of Exercise per Session:   Stress:   . Feeling of Stress :   Social Connections:   . Frequency of Communication with Friends and Family:   . Frequency of Social Gatherings with Friends and Family:   . Attends Religious Services:   . Active Member of Clubs or Organizations:   . Attends Archivist Meetings:   Marland Kitchen Marital Status:   Intimate Partner Violence:   . Fear of Current or Ex-Partner:   .  Emotionally Abused:   Marland Kitchen Physically Abused:   . Sexually Abused:     Outpatient Medications Prior to Visit  Medication Sig Dispense Refill  . ALPRAZolam (XANAX) 0.25 MG tablet TAKE 1 TABLET BY MOUTH TWICE DAILY AS NEEDED FOR SLEEP OR ANXIETY 60 tablet 0  . aspirin EC 81 MG tablet Take 81 mg by mouth daily.    . cetirizine (ZYRTEC) 10 MG tablet Take 1 tablet (10 mg total) by mouth daily. 30 tablet 11  . cholecalciferol (VITAMIN D) 1000 units tablet Take 1,000 Units by mouth daily.    Marland Kitchen EPINEPHrine 0.3 mg/0.3 mL IJ SOAJ injection INJECT 0.3ML INTO THE MUSCLE ONCE FOR ONE DOSE AS DIRECTED  4  . ezetimibe (ZETIA) 10 MG tablet TAKE 1 TABLET BY MOUTH DAILY 90 tablet 0  . fexofenadine (ALLEGRA) 180 MG tablet Take 180 mg by mouth daily.    . tamsulosin (FLOMAX) 0.4 MG CAPS capsule TAKE 1 CAPSULE(0.4 MG) BY MOUTH DAILY 90 capsule 0  . Testosterone 20.25 MG/1.25GM (1.62%) GEL Apply 1 spray on skin daily.      . Albuterol Sulfate (PROAIR RESPICLICK) 108 (90 Base) MCG/ACT AEPB Inhale 1 puff into the lungs 4 (four) times daily as needed. (Patient not taking: Reported on 06/27/2019) 1 each 1  . calcium-vitamin D (OSCAL WITH D) 500-200 MG-UNIT tablet Take 1 tablet by mouth daily with breakfast.     No facility-administered medications prior to visit.    Allergies  Allergen Reactions  . Bee Venom Anaphylaxis  . Amlodipine Other (See Comments)    Tired , foggy feeling  Tired , foggy feeling    . Pravastatin Other (See Comments)    Myalgia Myalgia   . Rosuvastatin Other (See Comments)    ROS Review of Systems  Constitutional: Negative.  Negative for diaphoresis, fatigue, fever and unexpected weight change.  HENT: Negative.   Respiratory: Negative for chest tightness, shortness of breath and wheezing.   Cardiovascular: Negative for chest pain and palpitations.  Gastrointestinal: Negative.   Neurological: Positive for light-headedness. Negative for speech difficulty and headaches.  Hematological: Negative.   Psychiatric/Behavioral: Negative.       Objective:    Physical Exam  Constitutional: He is oriented to person, place, and time. He appears well-developed and well-nourished. No distress.  HENT:  Head: Normocephalic and atraumatic.  Right Ear: External ear normal.  Left Ear: External ear normal.  Eyes: Conjunctivae are normal. Right eye exhibits no discharge. Left eye exhibits no discharge. No scleral icterus.  Neck: No JVD present. No tracheal deviation present.  Cardiovascular: Normal rate, regular rhythm and normal heart sounds.  Pulmonary/Chest: Effort normal and breath sounds normal. No stridor.  Abdominal: Bowel sounds are normal.  Neurological: He is alert and oriented to person, place, and time.  Skin: Skin is warm and dry. He is not diaphoretic.  Psychiatric: He has a normal mood and affect. His behavior is normal.    BP 124/80   Pulse 67   Temp (!) 96 F (35.6 C)  (Tympanic)   Ht 6\' 1"  (1.854 m)   Wt 200 lb (90.7 kg)   SpO2 95%   BMI 26.39 kg/m  Wt Readings from Last 3 Encounters:  06/27/19 200 lb (90.7 kg)  10/02/18 200 lb 4 oz (90.8 kg)  07/17/18 195 lb (88.5 kg)     There are no preventive care reminders to display for this patient.  There are no preventive care reminders to display for this patient.  Lab Results  Component Value Date   TSH 3.25 04/19/2017   Lab Results  Component Value Date   WBC 6.0 10/02/2018   HGB 15.6 10/02/2018   HCT 46.2 10/02/2018   MCV 95.7 10/02/2018   PLT 305.0 10/02/2018   Lab Results  Component Value Date   NA 139 10/02/2018   K 4.7 10/02/2018   CO2 27 10/02/2018   GLUCOSE 91 10/02/2018   BUN 12 10/02/2018   CREATININE 1.04 10/02/2018   BILITOT 0.7 05/18/2018   ALKPHOS 49 05/18/2018   AST 17 05/18/2018   ALT 15 05/18/2018   PROT 6.8 05/18/2018   ALBUMIN 4.5 05/18/2018   CALCIUM 9.1 10/02/2018   GFR 69.75 10/02/2018   Lab Results  Component Value Date   CHOL 181 05/18/2018   Lab Results  Component Value Date   HDL 46.60 05/18/2018   Lab Results  Component Value Date   LDLCALC 111 (H) 05/18/2018   Lab Results  Component Value Date   TRIG 117.0 05/18/2018   Lab Results  Component Value Date   CHOLHDL 4 05/18/2018   No results found for: HGBA1C    Assessment & Plan:   Problem List Items Addressed This Visit      Other   Abnormal CXR   Relevant Orders   DG Chest 2 View   Light headedness   Relevant Orders   Orthostatic vital signs   EKG 12-Lead (Completed)   DG Chest 2 View   UTI symptoms - Primary   Relevant Orders   POCT Urinalysis Dipstick (Completed)      No orders of the defined types were placed in this encounter.   Follow-up: Return next month for physical..   EKG did show low voltage but was otherwise normal.  Patient did not tilt with orthostatic checks.  We will continue to hydrate well.  Recheck of abnormal chest x-ray today. Mliss Sax, MD

## 2019-07-10 ENCOUNTER — Other Ambulatory Visit: Payer: Self-pay

## 2019-07-11 ENCOUNTER — Other Ambulatory Visit: Payer: Self-pay

## 2019-07-11 ENCOUNTER — Ambulatory Visit (INDEPENDENT_AMBULATORY_CARE_PROVIDER_SITE_OTHER): Payer: Medicare Other | Admitting: Family Medicine

## 2019-07-11 ENCOUNTER — Encounter: Payer: Self-pay | Admitting: Family Medicine

## 2019-07-11 VITALS — BP 120/78 | HR 70 | Temp 96.8°F | Ht 73.0 in | Wt 200.8 lb

## 2019-07-11 DIAGNOSIS — Z0000 Encounter for general adult medical examination without abnormal findings: Secondary | ICD-10-CM

## 2019-07-11 DIAGNOSIS — K649 Unspecified hemorrhoids: Secondary | ICD-10-CM

## 2019-07-11 DIAGNOSIS — F419 Anxiety disorder, unspecified: Secondary | ICD-10-CM

## 2019-07-11 DIAGNOSIS — R3912 Poor urinary stream: Secondary | ICD-10-CM

## 2019-07-11 DIAGNOSIS — N401 Enlarged prostate with lower urinary tract symptoms: Secondary | ICD-10-CM | POA: Diagnosis not present

## 2019-07-11 DIAGNOSIS — I700 Atherosclerosis of aorta: Secondary | ICD-10-CM | POA: Diagnosis not present

## 2019-07-11 LAB — COMPREHENSIVE METABOLIC PANEL
ALT: 17 U/L (ref 0–53)
AST: 19 U/L (ref 0–37)
Albumin: 4.4 g/dL (ref 3.5–5.2)
Alkaline Phosphatase: 46 U/L (ref 39–117)
BUN: 17 mg/dL (ref 6–23)
CO2: 30 mEq/L (ref 19–32)
Calcium: 9.3 mg/dL (ref 8.4–10.5)
Chloride: 104 mEq/L (ref 96–112)
Creatinine, Ser: 1.06 mg/dL (ref 0.40–1.50)
GFR: 68.09 mL/min (ref >60.00)
Glucose, Bld: 90 mg/dL (ref 70–99)
Potassium: 4.1 mEq/L (ref 3.5–5.1)
Sodium: 140 mEq/L (ref 135–145)
Total Bilirubin: 0.7 mg/dL (ref 0.2–1.2)
Total Protein: 6.6 g/dL (ref 6.0–8.3)

## 2019-07-11 LAB — LIPID PANEL
Cholesterol: 218 mg/dL — ABNORMAL HIGH (ref 0–200)
HDL: 45 mg/dL (ref >39.00)
LDL Cholesterol: 137 mg/dL — ABNORMAL HIGH (ref 0–99)
NonHDL: 172.84
Total CHOL/HDL Ratio: 5
Triglycerides: 179 mg/dL — ABNORMAL HIGH (ref 0.0–149.0)
VLDL: 35.8 mg/dL (ref 0.0–40.0)

## 2019-07-11 MED ORDER — EPINEPHRINE 0.3 MG/0.3ML IJ SOAJ: 0.3000 mg | INTRAMUSCULAR | 4 refills | Status: DC | PRN

## 2019-07-11 NOTE — Progress Notes (Signed)
Established Patient Office Visit  Subjective:  Patient ID: Daniel Blake, male    DOB: 07-19-1945  Age: 74 y.o. MRN: [id number]  CC:  Chief Complaint  Patient presents with  . Annual Exam    CPE, no concerns    HPI Daniel Blake presents for a physical exam and to go over several problems.  He would like his hemorrhoid removed.  He would like follow-up EGDs and colonoscopies.  He knows that he needs follow-up for his renal cyst with urology.  He has been taking the Flomax with success for his BPH symptoms.  But he wonders if there is a surgical procedure to correct his problem.  Experiences some morning chest congestion but denies shortness of breath dyspnea on exertion or any problems with exercise.  He walks on the treadmill daily.  Quit smoking years ago.  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  Labs drawn by them this past month showed PSA of 0.64 and a hemoglobin of 16.6.  Past Medical History:  Diagnosis Date  . Allergy   . Chicken pox   . GERD (gastroesophageal reflux disease)   . Hyperlipidemia   . Hypertension   . PUD (peptic ulcer disease)     Past Surgical History:  Procedure Laterality Date  . TONSILLECTOMY  1962    Family History  Problem Relation Age of Onset  . Heart disease Mother   . Hyperlipidemia Mother   . Hypertension Mother   . Asthma Father   . COPD Father   . Hyperlipidemia Father   . Hypertension Father   . Stroke Father   . Arthritis Sister   . Early death Sister   . Heart disease Brother   . Heart disease Brother   . Heart attack Brother   . Early death Brother     Social History   Socioeconomic History  . Marital status: Married    Spouse name: Not on file  . Number of children: Not on file  . Years of education: Not on file  . Highest education level: Not on file  Occupational History  . Not on file  Tobacco Use  . Smoking status: Former Smoker    Types: Cigarettes    Quit  date: 10/19/2017    Years since quitting: 1.7  . Smokeless tobacco: Never Used  Substance and Sexual Activity  . Alcohol use: No  . Drug use: No  . Sexual activity: Never  Other Topics Concern  . Not on file  Social History Narrative  . Not on file   Social Determinants of Health   Financial Resource Strain:   . Difficulty of Paying Living Expenses:   Food Insecurity:   . Worried About Programme researcher, broadcasting/film/video in the Last Year:   . Barista in the Last Year:   Transportation Needs:   . Freight forwarder (Medical):   Marland Kitchen Lack of Transportation (Non-Medical):   Physical Activity:   . Days of Exercise per Week:   . Minutes of Exercise per Session:   Stress:   . Feeling of Stress :   Social Connections:   . Frequency of Communication with Friends and Family:   . Frequency of Social Gatherings with Friends and Family:   . Attends Religious Services:   . Active Member of Clubs or Organizations:   . Attends Banker Meetings:   Marland Kitchen Marital Status:   Intimate Partner Violence:   .  Fear of Current or Ex-Partner:   . Emotionally Abused:   Marland Kitchen Physically Abused:   . Sexually Abused:     Outpatient Medications Prior to Visit  Medication Sig Dispense Refill  . Albuterol Sulfate (PROAIR RESPICLICK) 626 (90 Base) MCG/ACT AEPB Inhale 1 puff into the lungs 4 (four) times daily as needed. 1 each 1  . ALPRAZolam (XANAX) 0.25 MG tablet TAKE 1 TABLET BY MOUTH TWICE DAILY AS NEEDED FOR SLEEP OR ANXIETY 60 tablet 0  . aspirin EC 81 MG tablet Take 81 mg by mouth daily.    . cetirizine (ZYRTEC) 10 MG tablet Take 1 tablet (10 mg total) by mouth daily. 30 tablet 11  . cholecalciferol (VITAMIN D) 1000 units tablet Take 1,000 Units by mouth daily.    Marland Kitchen ezetimibe (ZETIA) 10 MG tablet TAKE 1 TABLET BY MOUTH DAILY 90 tablet 0  . tamsulosin (FLOMAX) 0.4 MG CAPS capsule TAKE 1 CAPSULE(0.4 MG) BY MOUTH DAILY 90 capsule 0  . Testosterone 20.25 MG/1.25GM (1.62%) GEL Apply 1 spray on skin  daily.    Marland Kitchen EPINEPHrine 0.3 mg/0.3 mL IJ SOAJ injection INJECT 0.3ML INTO THE MUSCLE ONCE FOR ONE DOSE AS DIRECTED  4  . calcium-vitamin D (OSCAL WITH D) 500-200 MG-UNIT tablet Take 1 tablet by mouth daily with breakfast.    . fexofenadine (ALLEGRA) 180 MG tablet Take 180 mg by mouth daily.     No facility-administered medications prior to visit.    Allergies  Allergen Reactions  . Bee Venom Anaphylaxis  . Amlodipine Other (See Comments)    Tired , foggy feeling  Tired , foggy feeling    . Pravastatin Other (See Comments)    Myalgia Myalgia   . Rosuvastatin Other (See Comments)    ROS Review of Systems  Constitutional: Negative.   HENT: Negative.   Eyes: Negative for photophobia and visual disturbance.  Respiratory: Negative.   Cardiovascular: Negative.   Gastrointestinal: Negative.   Endocrine: Negative for polyphagia and polyuria.  Genitourinary: Positive for difficulty urinating. Negative for frequency and urgency.  Musculoskeletal: Negative for gait problem and joint swelling.  Skin: Negative.   Allergic/Immunologic: Negative for immunocompromised state.  Neurological: Negative for speech difficulty and light-headedness.  Hematological: Does not bruise/bleed easily.  Psychiatric/Behavioral: Negative.    Depression screen [facility] 2/9 07/11/2019  Decreased Interest 0  Down, Depressed, Hopeless 0  PHQ - 2 Score 0  Altered sleeping 1  Tired, decreased energy 2  Change in appetite 1  Feeling bad or failure about yourself  1  Trouble concentrating 0  Moving slowly or fidgety/restless 0  Suicidal thoughts 0  PHQ-9 Score 5  Difficult doing work/chores Not difficult at all      Objective:    Physical Exam  Constitutional: He is oriented to person, place, and time. He appears well-developed and well-nourished. No distress.  HENT:  Head: Normocephalic and atraumatic.  Right Ear: External ear normal.  Left Ear: External ear normal.  Eyes: Pupils are equal, round, and  reactive to light. Conjunctivae are normal. Right eye exhibits no discharge. Left eye exhibits no discharge. No scleral icterus.  Neck: No JVD present. No tracheal deviation present. No thyromegaly present.  Cardiovascular: Normal rate, regular rhythm and normal heart sounds.  Pulmonary/Chest: Effort normal and breath sounds normal. No stridor.  Abdominal: Normal appearance and bowel sounds are normal. There is no splenomegaly or hepatomegaly. There is no abdominal tenderness.  Genitourinary: Rectum:     Guaiac result negative.     External  hemorrhoid present.     No rectal mass, anal fissure, tenderness, internal hemorrhoid or abnormal anal tone.  Prostate is enlarged. Prostate is not tender.  Musculoskeletal:        General: No edema.  Lymphadenopathy:    He has no cervical adenopathy.  Neurological: He is alert and oriented to person, place, and time.  Skin: Skin is warm and dry. He is not diaphoretic.  Psychiatric: He has a normal mood and affect. His behavior is normal.    BP 120/78   Pulse 70   Temp (!) 96.8 F (36 C) (Tympanic)   Ht 6\' 1"  (1.854 m)   Wt 200 lb 12.8 oz (91.1 kg)   SpO2 97%   BMI 26.49 kg/m  Wt Readings from Last 3 Encounters:  07/11/19 200 lb 12.8 oz (91.1 kg)  06/27/19 200 lb (90.7 kg)  10/02/18 200 lb 4 oz (90.8 kg)     There are no preventive care reminders to display for this patient.  There are no preventive care reminders to display for this patient.  Lab Results  Component Value Date   TSH 3.25 04/19/2017   Lab Results  Component Value Date   WBC 6.0 10/02/2018   HGB 15.6 10/02/2018   HCT 46.2 10/02/2018   MCV 95.7 10/02/2018   PLT 305.0 10/02/2018   Lab Results  Component Value Date   NA 139 10/02/2018   K 4.7 10/02/2018   CO2 27 10/02/2018   GLUCOSE 91 10/02/2018   BUN 12 10/02/2018   CREATININE 1.04 10/02/2018   BILITOT 0.7 05/18/2018   ALKPHOS 49 05/18/2018   AST 17 05/18/2018   ALT 15 05/18/2018   PROT 6.8 05/18/2018    ALBUMIN 4.5 05/18/2018   CALCIUM 9.1 10/02/2018   GFR 69.75 10/02/2018   Lab Results  Component Value Date   CHOL 181 05/18/2018   Lab Results  Component Value Date   HDL 46.60 05/18/2018   Lab Results  Component Value Date   LDLCALC 111 (H) 05/18/2018   Lab Results  Component Value Date   TRIG 117.0 05/18/2018   Lab Results  Component Value Date   CHOLHDL 4 05/18/2018   No results found for: HGBA1C    Assessment & Plan:   Problem List Items Addressed This Visit      Cardiovascular and Mediastinum   Atherosclerosis of aorta (HCC)   Hemorrhoids   Relevant Orders   Ambulatory referral to General Surgery     Other   Benign prostatic hyperplasia with weak urinary stream   Relevant Orders   Ambulatory referral to Urology   Anxiety    Other Visit Diagnoses    Healthcare maintenance    -  Primary   Relevant Orders   Comprehensive metabolic panel   Lipid panel      No orders of the defined types were placed in this encounter.   Follow-up: Return in about 6 months (around 01/10/2020).  Patient requests referral to general surgery for hemorrhoidectomy.  He would also like to follow-up with urology for his renal cyst and BPH.  He will follow-up with Dr. 03/11/2020 for his EGD and colonoscopy.  Recommended pulmonary follow-up for his chest congestion but he would like to hold off.  Calcifications noted on his aorta but patient is statin intolerant.  He is able to take Zetia.  Patient was given information on health maintenance and disease prevention.  Nedra Hai, MD

## 2019-07-11 NOTE — Patient Instructions (Signed)
Health Maintenance After Age 75 After age 39, you are at a higher risk for certain long-term diseases and infections as well as injuries from falls. Falls are a major cause of broken bones and head injuries in people who are older than age 66. Getting regular preventive care can help to keep you healthy and well. Preventive care includes getting regular testing and making lifestyle changes as recommended by your health care provider. Talk with your health care provider about:  Which screenings and tests you should have. A screening is a test that checks for a disease when you have no symptoms.  A diet and exercise plan that is right for you. What should I know about screenings and tests to prevent falls? Screening and testing are the best ways to find a health problem early. Early diagnosis and treatment give you the best chance of managing medical conditions that are common after age 8. Certain conditions and lifestyle choices may make you more likely to have a fall. Your health care provider may recommend:  Regular vision checks. Poor vision and conditions such as cataracts can make you more likely to have a fall. If you wear glasses, make sure to get your prescription updated if your vision changes.  Medicine review. Work with your health care provider to regularly review all of the medicines you are taking, including over-the-counter medicines. Ask your health care provider about any side effects that may make you more likely to have a fall. Tell your health care provider if any medicines that you take make you feel dizzy or sleepy.  Osteoporosis screening. Osteoporosis is a condition that causes the bones to get weaker. This can make the bones weak and cause them to break more easily.  Blood pressure screening. Blood pressure changes and medicines to control blood pressure can make you feel dizzy.  Strength and balance checks. Your health care provider may recommend certain tests to check your  strength and balance while standing, walking, or changing positions.  Foot health exam. Foot pain and numbness, as well as not wearing proper footwear, can make you more likely to have a fall.  Depression screening. You may be more likely to have a fall if you have a fear of falling, feel emotionally low, or feel unable to do activities that you used to do.  Alcohol use screening. Using too much alcohol can affect your balance and may make you more likely to have a fall. What actions can I take to lower my risk of falls? General instructions  Talk with your health care provider about your risks for falling. Tell your health care provider if: ? You fall. Be sure to tell your health care provider about all falls, even ones that seem minor. ? You feel dizzy, sleepy, or off-balance.  Take over-the-counter and prescription medicines only as told by your health care provider. These include any supplements.  Eat a healthy diet and maintain a healthy weight. A healthy diet includes low-fat dairy products, low-fat (lean) meats, and fiber from whole grains, beans, and lots of fruits and vegetables. Home safety  Remove any tripping hazards, such as rugs, cords, and clutter.  Install safety equipment such as grab bars in bathrooms and safety rails on stairs.  Keep rooms and walkways well-lit. Activity   Follow a regular exercise program to stay fit. This will help you maintain your balance. Ask your health care provider what types of exercise are appropriate for you.  If you need a cane or  walker, use it as recommended by your health care provider.  Wear supportive shoes that have nonskid soles. Lifestyle  Do not drink alcohol if your health care provider tells you not to drink.  If you drink alcohol, limit how much you have: ? 0-1 drink a day for women. ? 0-2 drinks a day for men.  Be aware of how much alcohol is in your drink. In the [place] one drink equals one typical bottle of beer (12  oz), one-half glass of wine (5 oz), or one shot of hard liquor (1 oz).  Do not use any products that contain nicotine or tobacco, such as cigarettes and e-cigarettes. If you need help quitting, ask your health care provider. Summary  Having a healthy lifestyle and getting preventive care can help to protect your health and wellness after age 27.  Screening and testing are the best way to find a health problem early and help you avoid having a fall. Early diagnosis and treatment give you the best chance for managing medical conditions that are more common for people who are older than age 13.  Falls are a major cause of broken bones and head injuries in people who are older than age 81. Take precautions to prevent a fall at home.  Work with your health care provider to learn what changes you can make to improve your health and wellness and to prevent falls. This information is not intended to replace advice given to you by your health care provider. Make sure you discuss any questions you have with your health care provider. Document Revised: 07/19/2018 Document Reviewed: 02/08/2017 Elsevier Patient Education  2020 [place] 65 Years and Older, Male Preventive care refers to lifestyle choices and visits with your health care provider that can promote health and wellness. This includes:  A yearly physical exam. This is also called an annual well check.  Regular dental and eye exams.  Immunizations.  Screening for certain conditions.  Healthy lifestyle choices, such as diet and exercise. What can I expect for my preventive care visit? Physical exam Your health care provider will check:  Height and weight. These may be used to calculate body mass index (BMI), which is a measurement that tells if you are at a healthy weight.  Heart rate and blood pressure.  Your skin for abnormal spots. Counseling Your health care provider may ask you questions  about:  Alcohol, tobacco, and drug use.  Emotional well-being.  Home and relationship well-being.  Sexual activity.  Eating habits.  History of falls.  Memory and ability to understand (cognition).  Work and work Statistician. What immunizations do I need?  Influenza (flu) vaccine  This is recommended every year. Tetanus, diphtheria, and pertussis (Tdap) vaccine  You may need a Td booster every 10 years. Varicella (chickenpox) vaccine  You may need this vaccine if you have not already been vaccinated. Zoster (shingles) vaccine  You may need this after age 76. Pneumococcal conjugate (PCV13) vaccine  One dose is recommended after age 49. Pneumococcal polysaccharide (PPSV23) vaccine  One dose is recommended after age 66. Measles, mumps, and rubella (MMR) vaccine  You may need at least one dose of MMR if you were born in 1957 or later. You may also need a second dose. Meningococcal conjugate (MenACWY) vaccine  You may need this if you have certain conditions. Hepatitis A vaccine  You may need this if you have certain conditions or if you travel or work in places where  you may be exposed to hepatitis A. Hepatitis B vaccine  You may need this if you have certain conditions or if you travel or work in places where you may be exposed to hepatitis B. Haemophilus influenzae type b (Hib) vaccine  You may need this if you have certain conditions. You may receive vaccines as individual doses or as more than one vaccine together in one shot (combination vaccines). Talk with your health care provider about the risks and benefits of combination vaccines. What tests do I need? Blood tests  Lipid and cholesterol levels. These may be checked every 5 years, or more frequently depending on your overall health.  Hepatitis C test.  Hepatitis B test. Screening  Lung cancer screening. You may have this screening every year starting at age 78 if you have a 30-pack-year history of  smoking and currently smoke or have quit within the past 15 years.  Colorectal cancer screening. All adults should have this screening starting at age 81 and continuing until age 12. Your health care provider may recommend screening at age 13 if you are at increased risk. You will have tests every 1-10 years, depending on your results and the type of screening test.  Prostate cancer screening. Recommendations will vary depending on your family history and other risks.  Diabetes screening. This is done by checking your blood sugar (glucose) after you have not eaten for a while (fasting). You may have this done every 1-3 years.  Abdominal aortic aneurysm (AAA) screening. You may need this if you are a current or former smoker.  Sexually transmitted disease (STD) testing. Follow these instructions at home: Eating and drinking  Eat a diet that includes fresh fruits and vegetables, whole grains, lean protein, and low-fat dairy products. Limit your intake of foods with high amounts of sugar, saturated fats, and salt.  Take vitamin and mineral supplements as recommended by your health care provider.  Do not drink alcohol if your health care provider tells you not to drink.  If you drink alcohol: ? Limit how much you have to 0-2 drinks a day. ? Be aware of how much alcohol is in your drink. In the [place] one drink equals one 12 oz bottle of beer (355 mL), one 5 oz glass of wine (148 mL), or one 1 oz glass of hard liquor (44 mL). Lifestyle  Take daily care of your teeth and gums.  Stay active. Exercise for at least 30 minutes on 5 or more days each week.  Do not use any products that contain nicotine or tobacco, such as cigarettes, e-cigarettes, and chewing tobacco. If you need help quitting, ask your health care provider.  If you are sexually active, practice safe sex. Use a condom or other form of protection to prevent STIs (sexually transmitted infections).  Talk with your health care  provider about taking a low-dose aspirin or statin. What's next?  Visit your health care provider once a year for a well check visit.  Ask your health care provider how often you should have your eyes and teeth checked.  Stay up to date on all vaccines. This information is not intended to replace advice given to you by your health care provider. Make sure you discuss any questions you have with your health care provider. Document Revised: 03/22/2018 Document Reviewed: 03/22/2018 Elsevier Patient Education  2020 [employer].

## 2019-07-22 ENCOUNTER — Telehealth: Payer: Self-pay | Admitting: Family Medicine

## 2019-07-22 DIAGNOSIS — K649 Unspecified hemorrhoids: Secondary | ICD-10-CM

## 2019-07-22 DIAGNOSIS — Z9889 Other specified postprocedural states: Secondary | ICD-10-CM

## 2019-07-22 NOTE — Telephone Encounter (Signed)
Spoke with patient who states that he would like to be referred to Dr. Elder Love in [facility] for Colonoscopy and hemrrhoids removed. Okay for the referral. Please advise.

## 2019-07-22 NOTE — Telephone Encounter (Signed)
Patient needs new referral ASAP to Dr. Elder Love in [place], [phone]. Pt states this surgeon can do all the procedures he needs.

## 2019-07-23 NOTE — Telephone Encounter (Signed)
Okay for referral?

## 2019-07-25 NOTE — Telephone Encounter (Signed)
Per Dr. Doreene Burke okay to refer patient to Dr. Elder Love located at [facility] in [facility] phone [phone]

## 2019-07-26 NOTE — Telephone Encounter (Signed)
Referral has been faxed.

## 2019-08-13 ENCOUNTER — Telehealth: Payer: Self-pay | Admitting: Family Medicine

## 2019-08-13 NOTE — Telephone Encounter (Signed)
Patient requesting call from Dr. Doreene Burke regarding test results done at [facility] and fatigue issues. Patient would rather speak to physician rather than wait for appt.

## 2019-08-14 NOTE — Telephone Encounter (Signed)
Haven't seen anything come in from [facility]. I would suggest that he talk to the physicians who ordered testing for him and then follow up with me. We can do a virtual.

## 2019-08-14 NOTE — Telephone Encounter (Signed)
Patient is returning the call. CB is [phone]

## 2019-08-14 NOTE — Telephone Encounter (Signed)
Called patient to inform response from Dr. Doreene Burke, no answer LMTCB

## 2019-08-15 NOTE — Telephone Encounter (Signed)
Returned patients call no answer, LMTCB 

## 2019-09-10 ENCOUNTER — Other Ambulatory Visit: Payer: Self-pay | Admitting: Family Medicine

## 2019-09-10 DIAGNOSIS — F419 Anxiety disorder, unspecified: Secondary | ICD-10-CM

## 2019-09-10 DIAGNOSIS — I700 Atherosclerosis of aorta: Secondary | ICD-10-CM

## 2019-09-10 DIAGNOSIS — E7800 Pure hypercholesterolemia, unspecified: Secondary | ICD-10-CM

## 2019-09-10 NOTE — Telephone Encounter (Signed)
[facility]-Plz see refill req/thx dmf °

## 2019-10-25 ENCOUNTER — Telehealth: Payer: Self-pay | Admitting: Family Medicine

## 2019-10-25 NOTE — Telephone Encounter (Signed)
Left message for patient to schedule Annual Wellness Visit.  Please schedule with Nurse Health Advisor Martha Stanley, RN at [facility]  °

## 2019-12-05 ENCOUNTER — Telehealth: Payer: Self-pay | Admitting: Family Medicine

## 2019-12-05 NOTE — Telephone Encounter (Signed)
error 

## 2019-12-07 ENCOUNTER — Other Ambulatory Visit: Payer: Self-pay | Admitting: Family Medicine

## 2019-12-17 ENCOUNTER — Other Ambulatory Visit: Payer: Self-pay | Admitting: Family Medicine

## 2019-12-17 DIAGNOSIS — I700 Atherosclerosis of aorta: Secondary | ICD-10-CM

## 2019-12-17 DIAGNOSIS — E7800 Pure hypercholesterolemia, unspecified: Secondary | ICD-10-CM

## 2019-12-24 ENCOUNTER — Other Ambulatory Visit: Payer: Self-pay | Admitting: Family Medicine

## 2019-12-24 DIAGNOSIS — F419 Anxiety disorder, unspecified: Secondary | ICD-10-CM

## 2020-01-17 ENCOUNTER — Other Ambulatory Visit: Payer: Self-pay

## 2020-01-17 ENCOUNTER — Encounter: Payer: Self-pay | Admitting: Family Medicine

## 2020-01-17 ENCOUNTER — Ambulatory Visit (INDEPENDENT_AMBULATORY_CARE_PROVIDER_SITE_OTHER): Payer: Medicare Other | Admitting: Family Medicine

## 2020-01-17 VITALS — BP 118/78 | HR 71 | Temp 97.3°F | Ht 73.0 in | Wt 191.6 lb

## 2020-01-17 DIAGNOSIS — J209 Acute bronchitis, unspecified: Secondary | ICD-10-CM

## 2020-01-17 DIAGNOSIS — J301 Allergic rhinitis due to pollen: Secondary | ICD-10-CM | POA: Diagnosis not present

## 2020-01-17 DIAGNOSIS — J440 Chronic obstructive pulmonary disease with acute lower respiratory infection: Secondary | ICD-10-CM | POA: Diagnosis not present

## 2020-01-17 MED ORDER — CETIRIZINE HCL 10 MG PO TABS: 10.0000 mg | ORAL_TABLET | Freq: Every day | ORAL | 2 refills | Status: DC

## 2020-01-17 MED ORDER — ATROVENT HFA 17 MCG/ACT IN AERS: 1.0000 | INHALATION_SPRAY | Freq: Four times a day (QID) | RESPIRATORY_TRACT | 12 refills | Status: DC | PRN

## 2020-01-17 MED ORDER — ALBUTEROL SULFATE HFA 108 (90 BASE) MCG/ACT IN AERS: 1.0000 | INHALATION_SPRAY | Freq: Four times a day (QID) | RESPIRATORY_TRACT | 0 refills | Status: DC | PRN

## 2020-01-17 MED ORDER — PREDNISONE 20 MG PO TABS: 20.0000 mg | ORAL_TABLET | Freq: Two times a day (BID) | ORAL | 0 refills | Status: AC

## 2020-01-17 MED ORDER — MOMETASONE FUROATE 50 MCG/ACT NA SUSP: 2.0000 | Freq: Every day | NASAL | 12 refills | Status: DC

## 2020-01-17 NOTE — Progress Notes (Signed)
Established Patient Office Visit  Subjective:  Patient ID: Daniel Blake, male    DOB: 12/22/1945  Age: 74 y.o. MRN: [id number]  CC:  Chief Complaint  Patient presents with  . Annual Exam    CPE    HPI Daniel Blake presents for physical exam but tells of a 2-week history of fatigue some malaise, nasal congestion sneezing watery eyes postnasal drip cough with some wheezing and shortness of breath.  Denies fevers chills purulent phlegm or rhinorrhea.  Quit smoking about a year ago.  Continues to smoke cigars on occasion.  Just has not felt well.  Does not feel as energetic as he did prior to this illness.  Some sadness associated with this.  Past Medical History:  Diagnosis Date  . Allergy   . Chicken pox   . GERD (gastroesophageal reflux disease)   . Hyperlipidemia   . Hypertension   . PUD (peptic ulcer disease)     Past Surgical History:  Procedure Laterality Date  . TONSILLECTOMY  1962    Family History  Problem Relation Age of Onset  . Heart disease Mother   . Hyperlipidemia Mother   . Hypertension Mother   . Asthma Father   . COPD Father   . Hyperlipidemia Father   . Hypertension Father   . Stroke Father   . Arthritis Sister   . Early death Sister   . Heart disease Brother   . Heart disease Brother   . Heart attack Brother   . Early death Brother     Social History   Socioeconomic History  . Marital status: Married    Spouse name: Not on file  . Number of children: Not on file  . Years of education: Not on file  . Highest education level: Not on file  Occupational History  . Not on file  Tobacco Use  . Smoking status: Former Smoker    Types: Cigarettes    Quit date: 10/19/2017    Years since quitting: 2.2  . Smokeless tobacco: Never Used  Substance and Sexual Activity  . Alcohol use: No  . Drug use: No  . Sexual activity: Never  Other Topics Concern  . Not on file  Social History Narrative  . Not on file   Social Determinants of Health    Financial Resource Strain:   . Difficulty of Paying Living Expenses: Not on file  Food Insecurity:   . Worried About Programme researcher, broadcasting/film/video in the Last Year: Not on file  . Ran Out of Food in the Last Year: Not on file  Transportation Needs:   . Lack of Transportation (Medical): Not on file  . Lack of Transportation (Non-Medical): Not on file  Physical Activity:   . Days of Exercise per Week: Not on file  . Minutes of Exercise per Session: Not on file  Stress:   . Feeling of Stress : Not on file  Social Connections:   . Frequency of Communication with Friends and Family: Not on file  . Frequency of Social Gatherings with Friends and Family: Not on file  . Attends Religious Services: Not on file  . Active Member of Clubs or Organizations: Not on file  . Attends Banker Meetings: Not on file  . Marital Status: Not on file  Intimate Partner Violence:   . Fear of Current or Ex-Partner: Not on file  . Emotionally Abused: Not on file  . Physically Abused: Not on file  . Sexually Abused: Not  on file    Outpatient Medications Prior to Visit  Medication Sig Dispense Refill  . ALPRAZolam (XANAX) 0.25 MG tablet TAKE 1 TABLET BY MOUTH TWICE DAILY AS NEEDED FOR SLEEP OR ANXIETY 60 tablet 0  . aspirin EC 81 MG tablet Take 81 mg by mouth daily.    . cetirizine (ZYRTEC) 10 MG tablet Take 1 tablet (10 mg total) by mouth daily. 30 tablet 11  . cholecalciferol (VITAMIN D) 1000 units tablet Take 1,000 Units by mouth daily.    Marland Kitchen EPINEPHrine 0.3 mg/0.3 mL IJ SOAJ injection Inject 0.3 mLs (0.3 mg total) into the muscle as needed for anaphylaxis. INJECT 0.3ML INTO THE MUSCLE ONCE FOR ONE DOSE AS DIRECTED 1 each 4  . ezetimibe (ZETIA) 10 MG tablet TAKE 1 TABLET BY MOUTH DAILY 90 tablet 0  . tamsulosin (FLOMAX) 0.4 MG CAPS capsule TAKE 1 CAPSULE(0.4 MG) BY MOUTH DAILY 90 capsule 0  . Testosterone 20.25 MG/1.25GM (1.62%) GEL Apply 1 spray on skin daily.    . fexofenadine (ALLEGRA) 180 MG  tablet Take 180 mg by mouth daily.    . calcium-vitamin D (OSCAL WITH D) 500-200 MG-UNIT tablet Take 1 tablet by mouth daily with breakfast. (Patient not taking: Reported on 01/17/2020)    . Albuterol Sulfate (PROAIR RESPICLICK) 108 (90 Base) MCG/ACT AEPB Inhale 1 puff into the lungs 4 (four) times daily as needed. (Patient not taking: Reported on 01/17/2020) 1 each 1   No facility-administered medications prior to visit.    Allergies  Allergen Reactions  . Bee Venom Anaphylaxis  . Amlodipine Other (See Comments)    Tired , foggy feeling  Tired , foggy feeling    . Pravastatin Other (See Comments)    Myalgia Myalgia   . Rosuvastatin Other (See Comments)    ROS Review of Systems  Constitutional: Positive for fatigue. Negative for chills, diaphoresis, fever and unexpected weight change.  HENT: Positive for congestion, postnasal drip, rhinorrhea and sneezing. Negative for sinus pressure and sinus pain.   Eyes: Negative for photophobia and visual disturbance.  Respiratory: Positive for cough and wheezing.   Cardiovascular: Negative for chest pain and palpitations.  Gastrointestinal: Negative.   Endocrine: Negative for polyphagia and polyuria.  Genitourinary: Negative.   Musculoskeletal: Negative for gait problem and joint swelling.  Allergic/Immunologic: Negative for immunocompromised state.  Neurological: Negative for tremors and speech difficulty.  Psychiatric/Behavioral: Negative.       Objective:    Physical Exam Vitals and nursing note reviewed.  Constitutional:      General: He is not in acute distress.    Appearance: Normal appearance. He is normal weight. He is not ill-appearing, toxic-appearing or diaphoretic.  HENT:     Head: Normocephalic and atraumatic.     Right Ear: Ear canal and external ear normal. There is impacted cerumen.     Left Ear: Ear canal and external ear normal. There is impacted cerumen.  Eyes:     General: No scleral icterus.       Right eye:  No discharge.        Left eye: No discharge.     Conjunctiva/sclera: Conjunctivae normal.     Pupils: Pupils are equal, round, and reactive to light.  Cardiovascular:     Rate and Rhythm: Normal rate and regular rhythm.  Pulmonary:     Effort: Pulmonary effort is normal. No respiratory distress.     Breath sounds: No stridor. Wheezing present. No rhonchi.  Abdominal:     General: Bowel sounds  are normal.  Musculoskeletal:     Cervical back: Tenderness present. No rigidity.     Right lower leg: No edema.     Left lower leg: No edema.  Lymphadenopathy:     Cervical: No cervical adenopathy.  Skin:    General: Skin is warm and dry.  Neurological:     Mental Status: He is alert and oriented to person, place, and time.  Psychiatric:        Mood and Affect: Mood normal.        Behavior: Behavior normal.     BP 118/78   Pulse 71   Temp (!) 97.3 F (36.3 C) (Tympanic)   Ht 6\' 1"  (1.854 m)   Wt 191 lb 9.6 oz (86.9 kg)   SpO2 97%   BMI 25.28 kg/m  Wt Readings from Last 3 Encounters:  01/17/20 191 lb 9.6 oz (86.9 kg)  07/11/19 200 lb 12.8 oz (91.1 kg)  06/27/19 200 lb (90.7 kg)     Health Maintenance Due  Topic Date Due  . INFLUENZA VACCINE  11/10/2019    There are no preventive care reminders to display for this patient.  Lab Results  Component Value Date   TSH 3.25 04/19/2017   Lab Results  Component Value Date   WBC 6.0 10/02/2018   HGB 15.6 10/02/2018   HCT 46.2 10/02/2018   MCV 95.7 10/02/2018   PLT 305.0 10/02/2018   Lab Results  Component Value Date   NA 140 07/11/2019   K 4.1 07/11/2019   CO2 30 07/11/2019   GLUCOSE 90 07/11/2019   BUN 17 07/11/2019   CREATININE 1.06 07/11/2019   BILITOT 0.7 07/11/2019   ALKPHOS 46 07/11/2019   AST 19 07/11/2019   ALT 17 07/11/2019   PROT 6.6 07/11/2019   ALBUMIN 4.4 07/11/2019   CALCIUM 9.3 07/11/2019   GFR 68.09 07/11/2019   Lab Results  Component Value Date   CHOL 218 (H) 07/11/2019   Lab Results   Component Value Date   HDL 45.00 07/11/2019   Lab Results  Component Value Date   LDLCALC 137 (H) 07/11/2019   Lab Results  Component Value Date   TRIG 179.0 (H) 07/11/2019   Lab Results  Component Value Date   CHOLHDL 5 07/11/2019   No results found for: HGBA1C    Assessment & Plan:   Problem List Items Addressed This Visit      Respiratory   Allergic rhinitis due to pollen - Primary   Relevant Medications   predniSONE (DELTASONE) 20 MG tablet   cetirizine (ZYRTEC ALLERGY) 10 MG tablet   mometasone (NASONEX) 50 MCG/ACT nasal spray   Other Relevant Orders   DG Chest 2 View    Other Visit Diagnoses    Acute bronchitis with COPD (HCC)       Relevant Medications   predniSONE (DELTASONE) 20 MG tablet   cetirizine (ZYRTEC ALLERGY) 10 MG tablet   mometasone (NASONEX) 50 MCG/ACT nasal spray   albuterol (VENTOLIN HFA) 108 (90 Base) MCG/ACT inhaler   ipratropium (ATROVENT HFA) 17 MCG/ACT inhaler      Meds ordered this encounter  Medications  . predniSONE (DELTASONE) 20 MG tablet    Sig: Take 1 tablet (20 mg total) by mouth 2 (two) times daily with a meal for 7 days.    Dispense:  14 tablet    Refill:  0  . cetirizine (ZYRTEC ALLERGY) 10 MG tablet    Sig: Take 1 tablet (10 mg total) by  mouth daily.    Dispense:  90 tablet    Refill:  2  . mometasone (NASONEX) 50 MCG/ACT nasal spray    Sig: Place 2 sprays into the nose daily.    Dispense:  1 each    Refill:  12  . albuterol (VENTOLIN HFA) 108 (90 Base) MCG/ACT inhaler    Sig: Inhale 1 puff into the lungs every 6 (six) hours as needed for wheezing or shortness of breath.    Dispense:  8 g    Refill:  0  . ipratropium (ATROVENT HFA) 17 MCG/ACT inhaler    Sig: Inhale 1 puff into the lungs every 6 (six) hours as needed for wheezing.    Dispense:  1 each    Refill:  12    Follow-up: Return in about 2 weeks (around 01/31/2020), or if symptoms worsen or fail to improve, for please use debrox drops.    Mliss Sax, MD

## 2020-02-04 ENCOUNTER — Ambulatory Visit: Payer: Medicare Other | Admitting: Family Medicine

## 2020-02-17 ENCOUNTER — Encounter: Payer: Self-pay | Admitting: Nurse Practitioner

## 2020-02-17 ENCOUNTER — Ambulatory Visit (INDEPENDENT_AMBULATORY_CARE_PROVIDER_SITE_OTHER): Payer: Medicare Other | Admitting: Nurse Practitioner

## 2020-02-17 ENCOUNTER — Other Ambulatory Visit: Payer: Self-pay

## 2020-02-17 ENCOUNTER — Ambulatory Visit (INDEPENDENT_AMBULATORY_CARE_PROVIDER_SITE_OTHER): Payer: Medicare Other

## 2020-02-17 VITALS — BP 120/78 | HR 70 | Temp 96.9°F | Ht 73.0 in | Wt 194.4 lb

## 2020-02-17 DIAGNOSIS — E7800 Pure hypercholesterolemia, unspecified: Secondary | ICD-10-CM

## 2020-02-17 DIAGNOSIS — E291 Testicular hypofunction: Secondary | ICD-10-CM | POA: Diagnosis not present

## 2020-02-17 DIAGNOSIS — R9389 Abnormal findings on diagnostic imaging of other specified body structures: Secondary | ICD-10-CM | POA: Diagnosis not present

## 2020-02-17 DIAGNOSIS — J301 Allergic rhinitis due to pollen: Secondary | ICD-10-CM | POA: Diagnosis not present

## 2020-02-17 LAB — CBC
HCT: 45.9 % (ref 39.0–52.0)
Hemoglobin: 15.7 g/dL (ref 13.0–17.0)
MCHC: 34.2 g/dL (ref 30.0–36.0)
MCV: 92.3 fl (ref 78.0–100.0)
Platelets: 287 10*3/uL (ref 150.0–400.0)
RBC: 4.97 Mil/uL (ref 4.22–5.81)
RDW: 12.9 % (ref 11.5–15.5)
WBC: 6 10*3/uL (ref 4.0–10.5)

## 2020-02-17 LAB — LIPID PANEL
Cholesterol: 180 mg/dL (ref 0–200)
HDL: 46.8 mg/dL (ref >39.00)
LDL Cholesterol: 109 mg/dL — ABNORMAL HIGH (ref 0–99)
NonHDL: 133.1
Total CHOL/HDL Ratio: 4
Triglycerides: 121 mg/dL (ref 0.0–149.0)
VLDL: 24.2 mg/dL (ref 0.0–40.0)

## 2020-02-17 LAB — TESTOSTERONE: Testosterone: 279.82 ng/dL — ABNORMAL LOW (ref 300.00–890.00)

## 2020-02-17 NOTE — Progress Notes (Signed)
Subjective:  Patient ID: Daniel Blake, male    DOB: 1945-11-07  Age: 74 y.o. MRN: [id number]  CC: Follow-up (2 week f/u pt states he is returning for blood work and chest xray. Pt would like kidney tested again to check on cyst.)   HPI Daniel Blake is here for re eval of chest congestion, hypercholesteremia and low testosterone.   Cough and sinus congestion: Improved with use of Rootology and elimination of cigar use. he quit 13months CXR completed 06/2019:Coarsened interstitial changes and mild bronchitic features are similar to the comparison.  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 Roanna Raider, she prescribed Testosterone gel 5days/week. His Last OV was 8month ago. He was testosterone repeated  Hypercholesteremia: He maintain heat healthy diet and daily exercise. He is atributing abnormal numbers to ETOH intake, but he stopped consumption for last 4months. Known aortic atherosclerosis, no DM, no HTN Lipid Panel     Component Value Date/Time   CHOL 218 (H) 07/11/2019 1047   TRIG 179.0 (H) 07/11/2019 1047   HDL 45.00 07/11/2019 1047   CHOLHDL 5 07/11/2019 1047   VLDL 35.8 07/11/2019 1047   LDLCALC 137 (H) 07/11/2019 1047   LDLDIRECT 143.0 04/19/2017 1349     Reviewed past Medical, Social and Family history today.  Outpatient Medications Prior to Visit  Medication Sig Dispense Refill   cholecalciferol (VITAMIN D) 1000 units tablet Take 1,000 Units by mouth daily.     EPINEPHrine 0.3 mg/0.3 mL IJ SOAJ injection Inject 0.3 mLs (0.3 mg total) into the muscle as needed for anaphylaxis. INJECT 0.3ML INTO THE MUSCLE ONCE FOR ONE DOSE AS DIRECTED 1 each 4   ezetimibe (ZETIA) 10 MG tablet TAKE 1 TABLET BY MOUTH DAILY 90 tablet 0   tamsulosin (FLOMAX) 0.4 MG CAPS capsule TAKE 1 CAPSULE(0.4 MG) BY MOUTH DAILY 90 capsule 0   Testosterone 20.25 MG/1.25GM (1.62%) GEL Apply 1 spray on skin daily.     albuterol (VENTOLIN HFA) 108 (90 Base) MCG/ACT inhaler Inhale 1 puff into the  lungs every 6 (six) hours as needed for wheezing or shortness of breath. (Patient not taking: Reported on 02/17/2020) 8 g 0   ALPRAZolam (XANAX) 0.25 MG tablet TAKE 1 TABLET BY MOUTH TWICE DAILY AS NEEDED FOR SLEEP OR ANXIETY (Patient not taking: Reported on 02/17/2020) 60 tablet 0   aspirin EC 81 MG tablet Take 81 mg by mouth daily. (Patient not taking: Reported on 02/17/2020)     calcium-vitamin D (OSCAL WITH D) 500-200 MG-UNIT tablet Take 1 tablet by mouth daily with breakfast. (Patient not taking: Reported on 01/17/2020)     cetirizine (ZYRTEC ALLERGY) 10 MG tablet Take 1 tablet (10 mg total) by mouth daily. (Patient not taking: Reported on 02/17/2020) 90 tablet 2   cetirizine (ZYRTEC) 10 MG tablet Take 1 tablet (10 mg total) by mouth daily. (Patient not taking: Reported on 02/17/2020) 30 tablet 11   ipratropium (ATROVENT HFA) 17 MCG/ACT inhaler Inhale 1 puff into the lungs every 6 (six) hours as needed for wheezing. (Patient not taking: Reported on 02/17/2020) 1 each 12   mometasone (NASONEX) 50 MCG/ACT nasal spray Place 2 sprays into the nose daily. (Patient not taking: Reported on 02/17/2020) 1 each 12   Testosterone 20.25 MG/1.25GM (1.62%) GEL Apply 1 spray on skin daily. (Patient not taking: Reported on 02/17/2020)     No facility-administered medications prior to visit.    ROS See HPI  Objective:  BP 120/78 (BP Location: Left Arm, Patient Position: Sitting, Cuff Size:  Large)    Pulse 70    Temp (!) 96.9 F (36.1 C) (Temporal)    Ht 6\' 1"  (1.854 m)    Wt 194 lb 6.4 oz (88.2 kg)    SpO2 98%    BMI 25.65 kg/m   Physical Exam Vitals reviewed.  Cardiovascular:     Rate and Rhythm: Normal rate and regular rhythm.     Pulses: Normal pulses.     Heart sounds: Normal heart sounds.  Musculoskeletal:     Right lower leg: No edema.     Left lower leg: No edema.  Neurological:     Mental Status: He is alert and oriented to person, place, and time.    Assessment & Plan:  This visit  occurred during the SARS-CoV-2 public health emergency.  Safety protocols were in place, including screening questions prior to the visit, additional usage of staff PPE, and extensive cleaning of exam room while observing appropriate contact time as indicated for disinfecting solutions.   Daniel Blake was seen today for follow-up.  Diagnoses and all orders for this visit:  Elevated cholesterol -     Lipid panel  Androgen deficiency -     Testosterone -     CBC  Abnormal CXR  Allergic rhinitis due to pollen, unspecified seasonality -     DG Chest 2 View   Problem List Items Addressed This Visit      Respiratory   Allergic rhinitis due to pollen     Other   Abnormal CXR   Elevated cholesterol - Primary   Relevant Orders   Lipid panel    Other Visit Diagnoses    Androgen deficiency       Relevant Medications   Testosterone 20.25 MG/1.25GM (1.62%) GEL   Other Relevant Orders   Testosterone   CBC      Follow-up: Return if symptoms worsen or fail to improve.  Daniel Hidden, NP

## 2020-02-17 NOTE — Patient Instructions (Signed)
Go to lab for blood draw You have an order in for CXR by Dr. Doreene Burke

## 2020-03-21 ENCOUNTER — Other Ambulatory Visit: Payer: Self-pay | Admitting: Family Medicine

## 2020-03-21 DIAGNOSIS — E7800 Pure hypercholesterolemia, unspecified: Secondary | ICD-10-CM

## 2020-03-21 DIAGNOSIS — F419 Anxiety disorder, unspecified: Secondary | ICD-10-CM

## 2020-03-21 DIAGNOSIS — I700 Atherosclerosis of aorta: Secondary | ICD-10-CM

## 2020-06-23 ENCOUNTER — Other Ambulatory Visit: Payer: Self-pay | Admitting: Family

## 2020-06-23 DIAGNOSIS — I700 Atherosclerosis of aorta: Secondary | ICD-10-CM

## 2020-06-23 DIAGNOSIS — F419 Anxiety disorder, unspecified: Secondary | ICD-10-CM

## 2020-06-23 DIAGNOSIS — E7800 Pure hypercholesterolemia, unspecified: Secondary | ICD-10-CM

## 2020-09-11 ENCOUNTER — Telehealth: Payer: Self-pay | Admitting: Family Medicine

## 2020-09-11 NOTE — Telephone Encounter (Signed)
Left message for patient to call back and schedule Medicare Annual Wellness Visit (AWV).   please offer to do virtually or by telephone.   Due for AWVI  Please schedule at anytime with Nurse Health Advisor.

## 2020-11-06 ENCOUNTER — Ambulatory Visit: Payer: Medicare Other | Admitting: Family Medicine

## 2020-12-30 ENCOUNTER — Telehealth: Payer: Self-pay | Admitting: Family Medicine

## 2020-12-30 NOTE — Telephone Encounter (Signed)
I called patient to schedule AWV.  Patient said Dr.Kremer was gone for a while and patient has found a new PCP.  Patient didn't want to give the name of the new provider. Please remove Dr.Kremer as PCP.

## 2021-02-22 ENCOUNTER — Ambulatory Visit: Payer: Medicare Other | Admitting: Internal Medicine

## 2022-03-04 IMAGING — DX DG CHEST 2V
2 series · 2 of 2 positions shown · non-contrast
Comparison: Radiograph 06/20/2018

CLINICAL DATA: Follow-up chest radiograph,

EXAM:
CHEST - 2 VIEW

[chest pa]
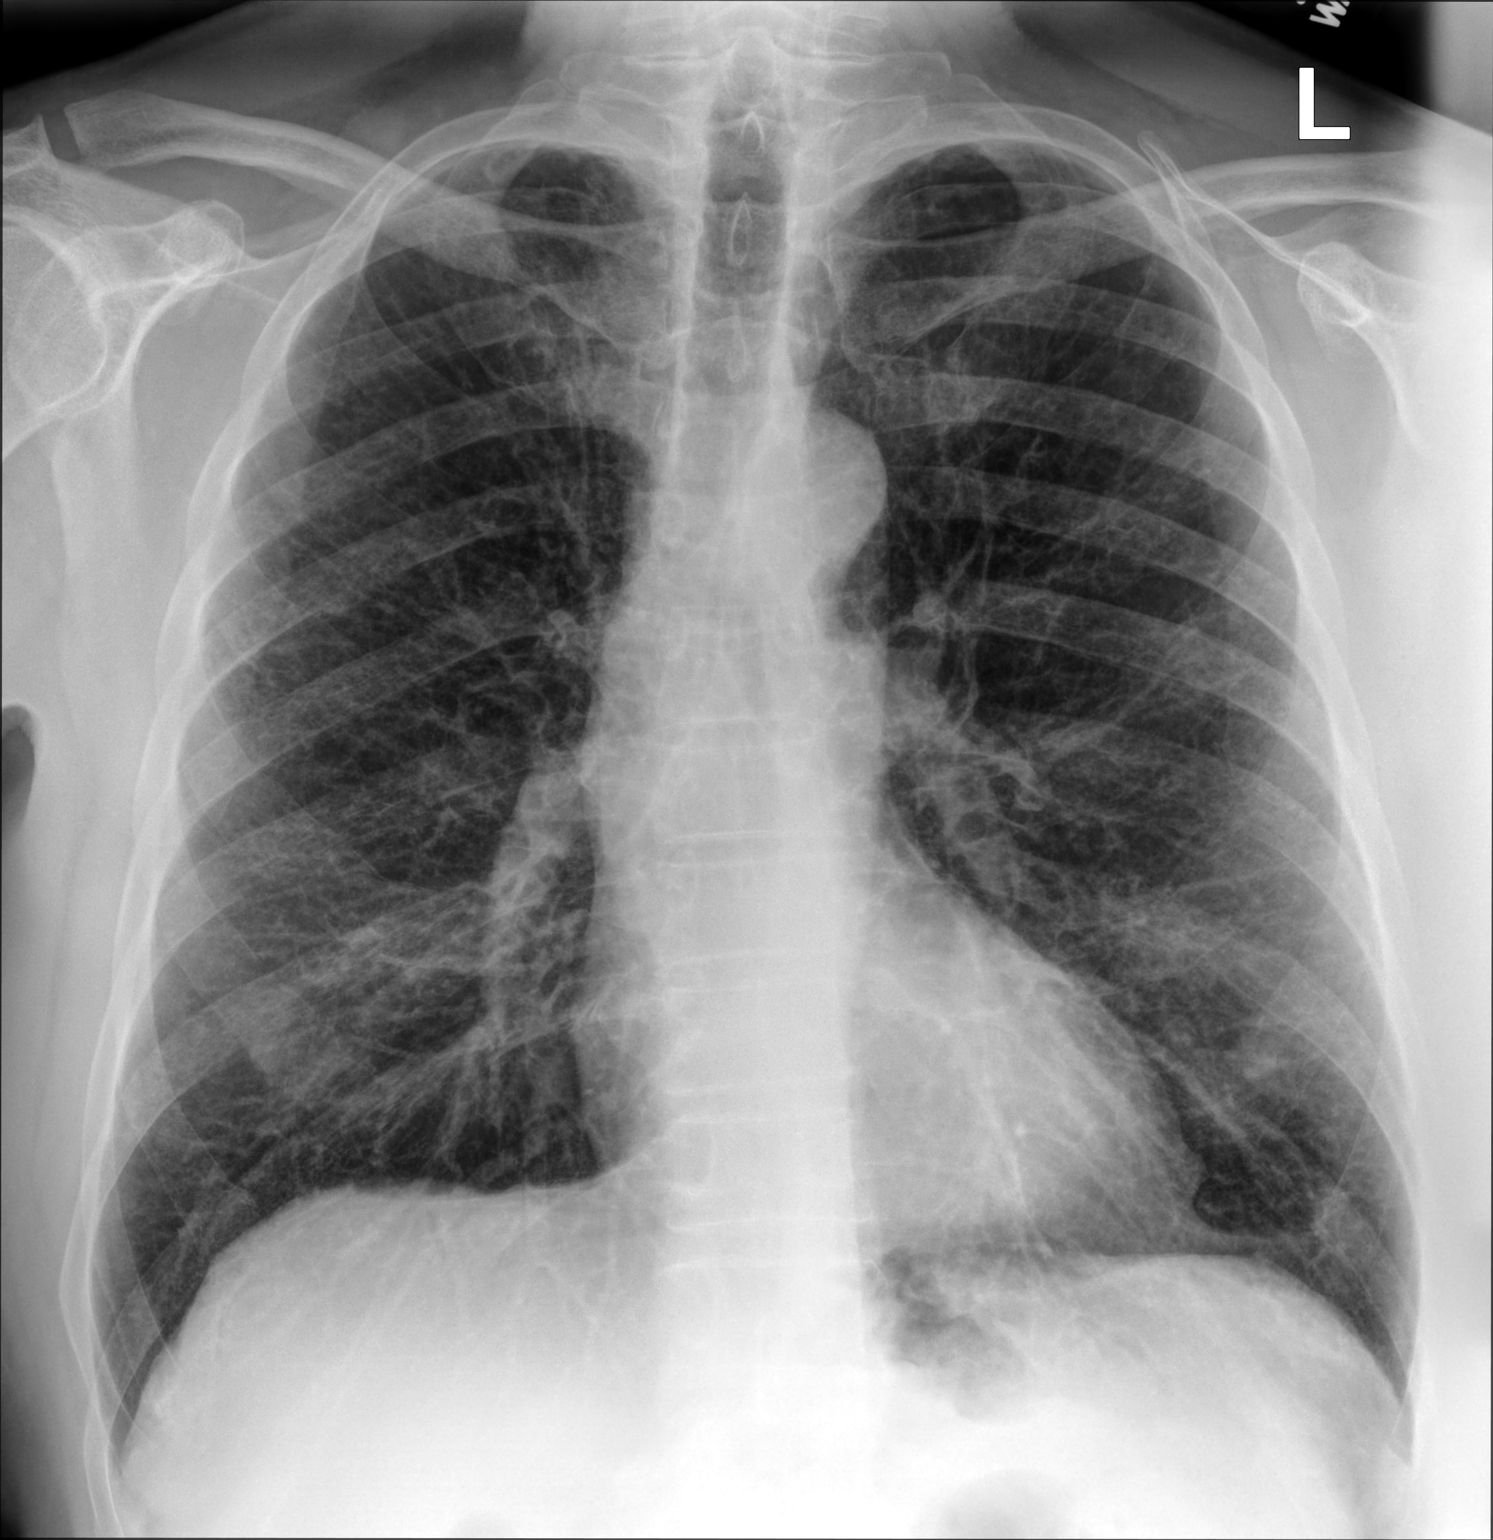

[chest lat]
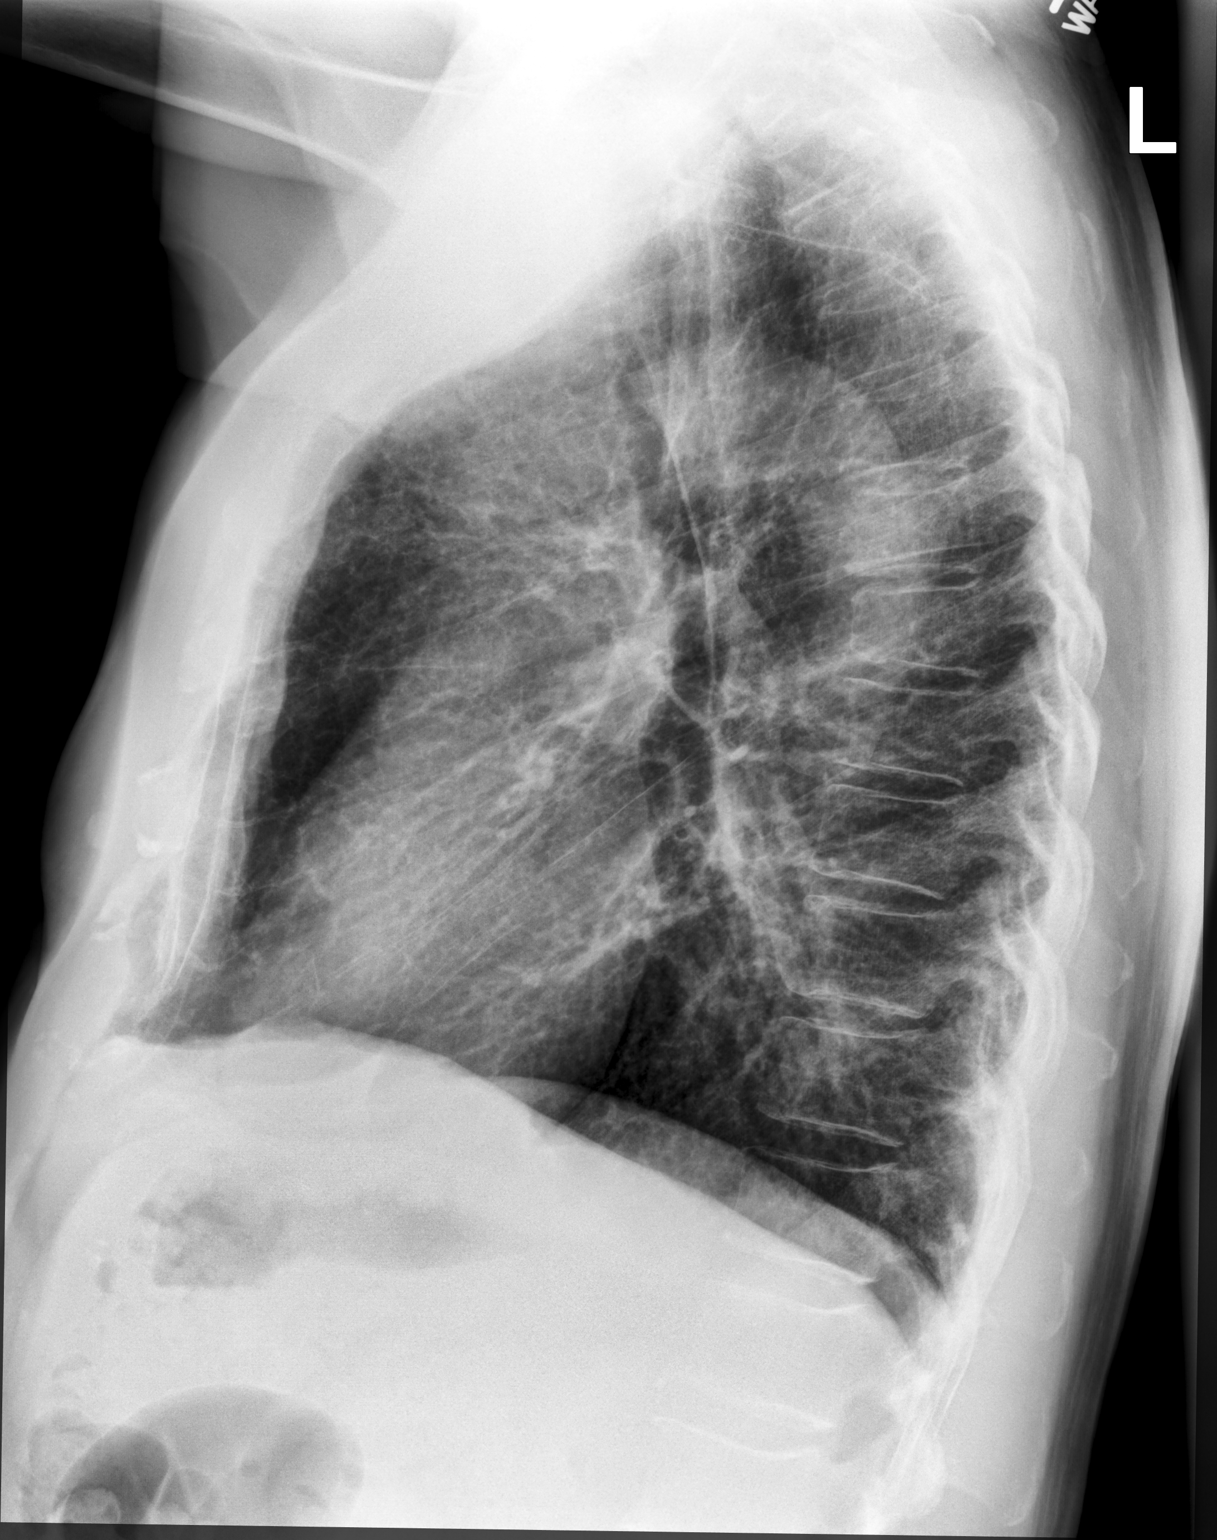

[2 of 2 positions shown; findings below may reference images not displayed]

FINDINGS: Coarsened interstitium similar to the comparison study with stable
mild bronchitic changes as well. No consolidation, features of
edema, pneumothorax, or effusion. The cardiomediastinal contours are
unremarkable. No acute osseous or soft tissue abnormality.
IMPRESSION: Coarsened interstitial changes and mild bronchitic features are
similar to the comparison.

No acute abnormality.

## 2023-04-25 ENCOUNTER — Observation Stay (HOSPITAL_BASED_OUTPATIENT_CLINIC_OR_DEPARTMENT_OTHER)
Admission: EM | Admit: 2023-04-25 | Discharge: 2023-04-27 | Disposition: A | Discharge status: Home / Self Care | Payer: Medicare Other | Attending: Internal Medicine | Admitting: Internal Medicine

## 2023-04-25 ENCOUNTER — Emergency Department (HOSPITAL_BASED_OUTPATIENT_CLINIC_OR_DEPARTMENT_OTHER): Payer: Medicare Other | Admitting: Certified Registered Nurse Anesthetist

## 2023-04-25 ENCOUNTER — Encounter (HOSPITAL_BASED_OUTPATIENT_CLINIC_OR_DEPARTMENT_OTHER): Payer: Self-pay

## 2023-04-25 ENCOUNTER — Emergency Department (HOSPITAL_BASED_OUTPATIENT_CLINIC_OR_DEPARTMENT_OTHER): Payer: Medicare Other

## 2023-04-25 ENCOUNTER — Other Ambulatory Visit: Payer: Self-pay

## 2023-04-25 ENCOUNTER — Emergency Department (HOSPITAL_COMMUNITY): Payer: Medicare Other | Admitting: Certified Registered Nurse Anesthetist

## 2023-04-25 ENCOUNTER — Encounter (HOSPITAL_COMMUNITY)
Admission: EM | Disposition: A | Discharge status: Home / Self Care | Payer: Self-pay | Source: Home / Self Care | Attending: Emergency Medicine

## 2023-04-25 DIAGNOSIS — K611 Rectal abscess: Secondary | ICD-10-CM | POA: Diagnosis not present

## 2023-04-25 DIAGNOSIS — Z79899 Other long term (current) drug therapy: Secondary | ICD-10-CM | POA: Diagnosis not present

## 2023-04-25 DIAGNOSIS — Z87891 Personal history of nicotine dependence: Secondary | ICD-10-CM | POA: Diagnosis not present

## 2023-04-25 DIAGNOSIS — K2100 Gastro-esophageal reflux disease with esophagitis, without bleeding: Secondary | ICD-10-CM | POA: Diagnosis not present

## 2023-04-25 DIAGNOSIS — N401 Enlarged prostate with lower urinary tract symptoms: Secondary | ICD-10-CM | POA: Diagnosis not present

## 2023-04-25 DIAGNOSIS — I10 Essential (primary) hypertension: Secondary | ICD-10-CM

## 2023-04-25 DIAGNOSIS — E785 Hyperlipidemia, unspecified: Secondary | ICD-10-CM | POA: Diagnosis not present

## 2023-04-25 DIAGNOSIS — K5900 Constipation, unspecified: Secondary | ICD-10-CM

## 2023-04-25 DIAGNOSIS — K219 Gastro-esophageal reflux disease without esophagitis: Secondary | ICD-10-CM

## 2023-04-25 DIAGNOSIS — R109 Unspecified abdominal pain: Secondary | ICD-10-CM | POA: Diagnosis present

## 2023-04-25 DIAGNOSIS — K5909 Other constipation: Secondary | ICD-10-CM

## 2023-04-25 DIAGNOSIS — R3912 Poor urinary stream: Secondary | ICD-10-CM | POA: Insufficient documentation

## 2023-04-25 HISTORY — PX: INCISION AND DRAINAGE PERIRECTAL ABSCESS: SHX1804

## 2023-04-25 LAB — BASIC METABOLIC PANEL
Anion gap: 8 (ref 5–15)
BUN: 13 mg/dL (ref 8–23)
CO2: 25 mmol/L (ref 22–32)
Calcium: 8.9 mg/dL (ref 8.9–10.3)
Chloride: 105 mmol/L (ref 98–111)
Creatinine, Ser: 1.03 mg/dL (ref 0.61–1.24)
GFR, Estimated: >60 mL/min (ref >60)
Glucose, Bld: 107 mg/dL — ABNORMAL HIGH (ref 70–99)
Potassium: 3.9 mmol/L (ref 3.5–5.1)
Sodium: 138 mmol/L (ref 135–145)

## 2023-04-25 LAB — CBC WITH DIFFERENTIAL/PLATELET
Abs Immature Granulocytes: 0.05 10*3/uL (ref 0.00–0.07)
Basophils Absolute: 0 10*3/uL (ref 0.0–0.1)
Basophils Relative: 0 %
Eosinophils Absolute: 0.1 10*3/uL (ref 0.0–0.5)
Eosinophils Relative: 1 %
HCT: 42.9 % (ref 39.0–52.0)
Hemoglobin: 14.4 g/dL (ref 13.0–17.0)
Immature Granulocytes: 0 %
Lymphocytes Relative: 13 %
Lymphs Abs: 1.8 10*3/uL (ref 0.7–4.0)
MCH: 30.8 pg (ref 26.0–34.0)
MCHC: 33.6 g/dL (ref 30.0–36.0)
MCV: 91.9 fL (ref 80.0–100.0)
Monocytes Absolute: 1.3 10*3/uL — ABNORMAL HIGH (ref 0.1–1.0)
Monocytes Relative: 9 %
Neutro Abs: 10.5 10*3/uL — ABNORMAL HIGH (ref 1.7–7.7)
Neutrophils Relative %: 77 %
Platelets: 303 10*3/uL (ref 150–400)
RBC: 4.67 MIL/uL (ref 4.22–5.81)
RDW: 11.7 % (ref 11.5–15.5)
WBC: 13.7 10*3/uL — ABNORMAL HIGH (ref 4.0–10.5)
nRBC: 0 % (ref 0.0–0.2)

## 2023-04-25 LAB — URINALYSIS, ROUTINE W REFLEX MICROSCOPIC
Bilirubin Urine: NEGATIVE
Glucose, UA: NEGATIVE mg/dL
Ketones, ur: NEGATIVE mg/dL
Leukocytes,Ua: NEGATIVE
Nitrite: NEGATIVE
Protein, ur: NEGATIVE mg/dL
Specific Gravity, Urine: 1.01 (ref 1.005–1.030)
pH: 5.5 (ref 5.0–8.0)

## 2023-04-25 LAB — URINALYSIS, MICROSCOPIC (REFLEX)

## 2023-04-25 LAB — HEPATIC FUNCTION PANEL
ALT: 16 U/L (ref 0–44)
AST: 16 U/L (ref 15–41)
Albumin: 4 g/dL (ref 3.5–5.0)
Alkaline Phosphatase: 45 U/L (ref 38–126)
Bilirubin, Direct: 0.1 mg/dL (ref 0.0–0.2)
Indirect Bilirubin: 0.6 mg/dL (ref 0.3–0.9)
Total Bilirubin: 0.7 mg/dL (ref 0.0–1.2)
Total Protein: 7.3 g/dL (ref 6.5–8.1)

## 2023-04-25 SURGERY — INCISION AND DRAINAGE, ABSCESS, PERIRECTAL
Anesthesia: General

## 2023-04-25 MED ORDER — FENTANYL CITRATE (PF) 100 MCG/2ML IJ SOLN
25.0000 ug | INTRAMUSCULAR | Status: DC | PRN
Start: 2023-04-25 — End: 2023-04-25
  Administered 2023-04-25: 50 ug via INTRAVENOUS

## 2023-04-25 MED ORDER — PROPOFOL 10 MG/ML IV BOLUS
INTRAVENOUS | Status: AC
  Filled 2023-04-25: qty 20

## 2023-04-25 MED ORDER — PHENYLEPHRINE 80 MCG/ML (10ML) SYRINGE FOR IV PUSH (FOR BLOOD PRESSURE SUPPORT)
PREFILLED_SYRINGE | INTRAVENOUS | Status: DC | PRN
  Administered 2023-04-25 (×4): 80 ug via INTRAVENOUS

## 2023-04-25 MED ORDER — ALPRAZOLAM 0.25 MG PO TABS
0.2500 mg | ORAL_TABLET | Freq: Every evening | ORAL | Status: DC | PRN
  Administered 2023-04-26: 0.25 mg via ORAL
  Filled 2023-04-25: qty 1

## 2023-04-25 MED ORDER — ONDANSETRON HCL 4 MG/2ML IJ SOLN
INTRAMUSCULAR | Status: DC | PRN
  Administered 2023-04-25: 4 mg via INTRAVENOUS

## 2023-04-25 MED ORDER — ACETAMINOPHEN 10 MG/ML IV SOLN
INTRAVENOUS | Status: AC
  Filled 2023-04-25: qty 100

## 2023-04-25 MED ORDER — FENTANYL CITRATE (PF) 250 MCG/5ML IJ SOLN
INTRAMUSCULAR | Status: AC
  Filled 2023-04-25: qty 5

## 2023-04-25 MED ORDER — CHLORHEXIDINE GLUCONATE 0.12 % MT SOLN
15.0000 mL | Freq: Once | OROMUCOSAL | Status: DC
  Filled 2023-04-25: qty 15

## 2023-04-25 MED ORDER — OXYCODONE HCL 5 MG/5ML PO SOLN
5.0000 mg | Freq: Once | ORAL | Status: DC | PRN
Start: 2023-04-25 — End: 2023-04-25

## 2023-04-25 MED ORDER — OXYCODONE HCL 5 MG PO TABS: 5.0000 mg | ORAL_TABLET | Freq: Once | ORAL | Status: DC | PRN

## 2023-04-25 MED ORDER — LIDOCAINE HCL (PF) 2 % IJ SOLN
INTRAMUSCULAR | Status: DC | PRN
  Administered 2023-04-25: 60 mg via INTRADERMAL

## 2023-04-25 MED ORDER — PROPOFOL 10 MG/ML IV BOLUS
INTRAVENOUS | Status: DC | PRN
  Administered 2023-04-25: 160 mg via INTRAVENOUS

## 2023-04-25 MED ORDER — FENTANYL CITRATE PF 50 MCG/ML IJ SOSY
50.0000 ug | PREFILLED_SYRINGE | Freq: Once | INTRAMUSCULAR | Status: AC
  Administered 2023-04-25: 50 ug via INTRAVENOUS
  Filled 2023-04-25: qty 1

## 2023-04-25 MED ORDER — ACETAMINOPHEN 10 MG/ML IV SOLN
1000.0000 mg | Freq: Once | INTRAVENOUS | Status: DC | PRN
  Administered 2023-04-25: 1000 mg via INTRAVENOUS

## 2023-04-25 MED ORDER — GATIFLOXACIN 0.5 % OP SOLN
1.0000 [drp] | Freq: Four times a day (QID) | OPHTHALMIC | Status: DC
Start: 2023-04-25 — End: 2023-04-27
  Administered 2023-04-25 – 2023-04-27 (×5): 1 [drp] via OPHTHALMIC
  Filled 2023-04-25: qty 2.5

## 2023-04-25 MED ORDER — IOHEXOL 300 MG/ML  SOLN
100.0000 mL | Freq: Once | INTRAMUSCULAR | Status: AC | PRN
  Administered 2023-04-25: 100 mL via INTRAVENOUS

## 2023-04-25 MED ORDER — BUPIVACAINE-EPINEPHRINE 0.25% -1:200000 IJ SOLN
INTRAMUSCULAR | Status: DC | PRN
  Administered 2023-04-25 (×2): 20 mL

## 2023-04-25 MED ORDER — PREDNISOLONE ACETATE 1 % OP SUSP
1.0000 [drp] | Freq: Four times a day (QID) | OPHTHALMIC | Status: DC
  Administered 2023-04-25 – 2023-04-27 (×5): 1 [drp] via OPHTHALMIC
  Filled 2023-04-25: qty 5

## 2023-04-25 MED ORDER — ONDANSETRON HCL 4 MG/2ML IJ SOLN
INTRAMUSCULAR | Status: AC
  Filled 2023-04-25: qty 2

## 2023-04-25 MED ORDER — ACETAMINOPHEN 160 MG/5ML PO SOLN: 1000.0000 mg | Freq: Once | ORAL | Status: DC | PRN

## 2023-04-25 MED ORDER — 0.9 % SODIUM CHLORIDE (POUR BTL) OPTIME
TOPICAL | Status: DC | PRN
  Administered 2023-04-25: 1000 mL

## 2023-04-25 MED ORDER — BUPIVACAINE-EPINEPHRINE (PF) 0.25% -1:200000 IJ SOLN
INTRAMUSCULAR | Status: AC
  Filled 2023-04-25: qty 30

## 2023-04-25 MED ORDER — PHENYLEPHRINE HCL-NACL 20-0.9 MG/250ML-% IV SOLN
INTRAVENOUS | Status: DC | PRN
  Administered 2023-04-25: 30 ug/min via INTRAVENOUS

## 2023-04-25 MED ORDER — PIPERACILLIN-TAZOBACTAM 3.375 G IVPB 30 MIN
3.3750 g | Freq: Once | INTRAVENOUS | Status: AC
  Administered 2023-04-25: 3.375 g via INTRAVENOUS
  Filled 2023-04-25: qty 50

## 2023-04-25 MED ORDER — ACETAMINOPHEN 650 MG RE SUPP: 650.0000 mg | Freq: Four times a day (QID) | RECTAL | Status: DC | PRN

## 2023-04-25 MED ORDER — PIPERACILLIN-TAZOBACTAM 3.375 G IVPB
3.3750 g | Freq: Three times a day (TID) | INTRAVENOUS | Status: DC
  Administered 2023-04-26 (×3): 3.375 g via INTRAVENOUS
  Filled 2023-04-25 (×3): qty 50

## 2023-04-25 MED ORDER — LIDOCAINE HCL URETHRAL/MUCOSAL 2 % EX GEL
1.0000 | Freq: Once | CUTANEOUS | Status: AC
  Administered 2023-04-25: 1 via URETHRAL
  Filled 2023-04-25: qty 11

## 2023-04-25 MED ORDER — ACETAMINOPHEN 325 MG PO TABS: 650.0000 mg | ORAL_TABLET | Freq: Four times a day (QID) | ORAL | Status: DC | PRN

## 2023-04-25 MED ORDER — EZETIMIBE 10 MG PO TABS
10.0000 mg | ORAL_TABLET | Freq: Every day | ORAL | Status: DC
  Administered 2023-04-25 – 2023-04-27 (×3): 10 mg via ORAL
  Filled 2023-04-25 (×3): qty 1

## 2023-04-25 MED ORDER — ACETAMINOPHEN 500 MG PO TABS: 1000.0000 mg | ORAL_TABLET | Freq: Once | ORAL | Status: DC | PRN

## 2023-04-25 MED ORDER — TAMSULOSIN HCL 0.4 MG PO CAPS
0.4000 mg | ORAL_CAPSULE | Freq: Every day | ORAL | Status: DC
  Administered 2023-04-25 – 2023-04-27 (×3): 0.4 mg via ORAL
  Filled 2023-04-25 (×3): qty 1

## 2023-04-25 MED ORDER — FENTANYL CITRATE (PF) 100 MCG/2ML IJ SOLN
INTRAMUSCULAR | Status: AC
  Filled 2023-04-25: qty 2

## 2023-04-25 MED ORDER — LACTATED RINGERS IV SOLN: INTRAVENOUS | Status: DC

## 2023-04-25 MED ORDER — FENTANYL CITRATE (PF) 250 MCG/5ML IJ SOLN
INTRAMUSCULAR | Status: DC | PRN
  Administered 2023-04-25 (×2): 25 ug via INTRAVENOUS

## 2023-04-25 MED ORDER — ORAL CARE MOUTH RINSE: 15.0000 mL | Freq: Once | OROMUCOSAL | Status: DC

## 2023-04-25 SURGICAL SUPPLY — 30 items
BAG COUNTER SPONGE SURGICOUNT (BAG) ×1 IMPLANT
BRIEF MESH DISP LRG (UNDERPADS AND DIAPERS) ×1 IMPLANT
CANISTER SUCT 3000ML PPV (MISCELLANEOUS) ×1 IMPLANT
COVER SURGICAL LIGHT HANDLE (MISCELLANEOUS) ×1 IMPLANT
ELECT REM PT RETURN 9FT ADLT (ELECTROSURGICAL)
ELECTRODE REM PT RTRN 9FT ADLT (ELECTROSURGICAL) IMPLANT
GAUZE PACKING IODOFORM 1/2INX (GAUZE/BANDAGES/DRESSINGS) IMPLANT
GAUZE PAD ABD 8X10 STRL (GAUZE/BANDAGES/DRESSINGS) IMPLANT
GAUZE SPONGE 4X4 12PLY STRL (GAUZE/BANDAGES/DRESSINGS) IMPLANT
GLOVE BIO SURGEON STRL SZ7.5 (GLOVE) ×1 IMPLANT
GLOVE INDICATOR 8.0 STRL GRN (GLOVE) ×1 IMPLANT
GOWN STRL REUS W/ TWL LRG LVL3 (GOWN DISPOSABLE) ×1 IMPLANT
GOWN STRL REUS W/ TWL XL LVL3 (GOWN DISPOSABLE) ×1 IMPLANT
KIT BASIN OR (CUSTOM PROCEDURE TRAY) ×1 IMPLANT
KIT TURNOVER KIT B (KITS) ×1 IMPLANT
NDL 18GX1X1/2 (RX/OR ONLY) (NEEDLE) IMPLANT
NEEDLE 18GX1X1/2 (RX/OR ONLY) (NEEDLE) IMPLANT
NS IRRIG 1000ML POUR BTL (IV SOLUTION) ×1 IMPLANT
PACK LITHOTOMY IV (CUSTOM PROCEDURE TRAY) ×1 IMPLANT
PAD ARMBOARD 7.5X6 YLW CONV (MISCELLANEOUS) ×1 IMPLANT
PENCIL SMOKE EVACUATOR (MISCELLANEOUS) ×1 IMPLANT
SPONGE T-LAP 18X18 ~~LOC~~+RFID (SPONGE) ×1 IMPLANT
SWAB COLLECTION DEVICE MRSA (MISCELLANEOUS) IMPLANT
SWAB CULTURE ESWAB REG 1ML (MISCELLANEOUS) IMPLANT
SYR CONTROL 10ML LL (SYRINGE) IMPLANT
TOWEL GREEN STERILE (TOWEL DISPOSABLE) ×1 IMPLANT
TOWEL GREEN STERILE FF (TOWEL DISPOSABLE) ×1 IMPLANT
TUBE CONNECTING 12X1/4 (SUCTIONS) ×1 IMPLANT
UNDERPAD 30X36 HEAVY ABSORB (UNDERPADS AND DIAPERS) ×1 IMPLANT
YANKAUER SUCT BULB TIP NO VENT (SUCTIONS) ×1 IMPLANT

## 2023-04-25 NOTE — Progress Notes (Signed)
 Pharmacy Antibiotic Note  Daniel Blake is a 78 y.o. male admitted on 04/25/2023 with  perirectal abscess .  Pharmacy has been consulted for Zosyn  dosing. Pt s/p I&D 1/14.   Pt currently on Zosyn  3.375gm IV q8h which is appropriate dose - started today.  Plan: Continue Zosyn  3.375gm IV q8h - each dose over 4 hours Will f/u renal function, micro data, and pt's clinical condition   Height: 6' 1 (185.4 cm) Weight: 90.7 kg (200 lb) IBW/kg (Calculated) : 79.9  Temp (24hrs), Avg:98.4 F (36.9 C), Min:98.2 F (36.8 C), Max:99.2 F (37.3 C)  Recent Labs  Lab 04/25/23 0726  WBC 13.7*  CREATININE 1.03    Estimated Creatinine Clearance: 66.8 mL/min (by C-G formula based on SCr of 1.03 mg/dL).    Allergies  Allergen Reactions   Bee Venom Anaphylaxis   Amlodipine Other (See Comments)    Tired , foggy feeling  Tired , foggy feeling     Pravastatin Other (See Comments)    Myalgia Myalgia    Rosuvastatin Other (See Comments)    Antimicrobials this admission: 1/14 Zosyn  >>   Microbiology results: 1/14 UCx:    Thank you for allowing pharmacy to be a part of this patient's care.  Vito Ralph, PharmD, BCPS Please see amion for complete clinical pharmacist phone list 04/25/2023 5:25 PM

## 2023-04-25 NOTE — ED Provider Notes (Signed)
 [facility] Provider Note   CSN: [id number] Arrival date & time: 04/25/23  9383     History  Chief Complaint  Patient presents with   Abdominal Pain   Rectal Pain   Urinary Retention    Daniel Blake is a 78 y.o. male.  Patient here with urinary retention.  History of high cholesterol hypertension.  He feels full in his belly.  Symptoms of constipation as well.  He has been taking Flomax  prescribed by urology as he is been dealing with some bladder issues here recently.  He denies any nausea or vomiting.  He feels like when he had a bowel movement he heard something on his abdomen.  Denies any weakness numbness tingling.  Denies any fever or chills.  Denies any chest pain shortness of breath.  The history is provided by the patient.       Home Medications Prior to Admission medications   Medication Sig Start Date End Date Taking? Authorizing Provider  ALPRAZolam  (XANAX ) 0.25 MG tablet TAKE 1 TABLET BY MOUTH TWICE DAILY AS NEEDED FOR SLEEP OR ANXIETY 06/24/20   Webb, Padonda B, FNP  cholecalciferol  (VITAMIN D ) 1000 units tablet Take 1,000 Units by mouth daily.    [provider]  EPINEPHrine  0.3 mg/0.3 mL IJ SOAJ injection Inject 0.3 mLs (0.3 mg total) into the muscle as needed for anaphylaxis. INJECT 0.3ML INTO THE MUSCLE ONCE FOR ONE DOSE AS DIRECTED 07/11/19   Berneta Elsie Sayre, MD  ezetimibe  (ZETIA ) 10 MG tablet TAKE 1 TABLET BY MOUTH DAILY 06/24/20   Webb, Padonda B, FNP  tamsulosin  (FLOMAX ) 0.4 MG CAPS capsule TAKE 1 CAPSULE(0.4 MG) BY MOUTH DAILY 06/24/20   Webb, Padonda B, FNP  Testosterone  20.25 MG/1.25GM (1.62%) GEL Apply 1 spray on skin daily. 02/17/20   Nche, Roselie Rockford, NP      Allergies    Bee venom, Amlodipine, Pravastatin, and Rosuvastatin    Review of Systems   Review of Systems  Physical Exam Updated Vital Signs BP 135/84   Pulse 90   Temp 98.3 F (36.8 C)   Resp 20   Ht 6' 1 (1.854 m)   Wt 90.7  kg   SpO2 97%   BMI 26.39 kg/m  Physical Exam Vitals and nursing note reviewed.  Constitutional:      General: He is not in acute distress.    Appearance: He is well-developed.  HENT:     Head: Normocephalic and atraumatic.  Eyes:     Extraocular Movements: Extraocular movements intact.     Conjunctiva/sclera: Conjunctivae normal.  Cardiovascular:     Rate and Rhythm: Normal rate and regular rhythm.     Heart sounds: Normal heart sounds. No murmur heard. Pulmonary:     Effort: Pulmonary effort is normal. No respiratory distress.     Breath sounds: Normal breath sounds.  Abdominal:     General: There is distension.     Palpations: Abdomen is soft.     Tenderness: There is no abdominal tenderness.  Musculoskeletal:        General: No swelling.     Cervical back: Neck supple.  Skin:    General: Skin is warm and dry.     Capillary Refill: Capillary refill takes less than 2 seconds.  Neurological:     Mental Status: He is alert.  Psychiatric:        Mood and Affect: Mood normal.     ED Results / Procedures / Treatments  Labs (all labs ordered are listed, but only abnormal results are displayed) Labs Reviewed  CBC WITH DIFFERENTIAL/PLATELET - Abnormal; Notable for the following components:      Result Value   WBC 13.7 (*)    Neutro Abs 10.5 (*)    Monocytes Absolute 1.3 (*)    All other components within normal limits  BASIC METABOLIC PANEL - Abnormal; Notable for the following components:   Glucose, Bld 107 (*)    All other components within normal limits  URINALYSIS, ROUTINE W REFLEX MICROSCOPIC - Abnormal; Notable for the following components:   Hgb urine dipstick MODERATE (*)    All other components within normal limits  URINALYSIS, MICROSCOPIC (REFLEX) - Abnormal; Notable for the following components:   Bacteria, UA RARE (*)    All other components within normal limits  URINE CULTURE  HEPATIC FUNCTION PANEL    EKG None  Radiology CT ABDOMEN PELVIS W  CONTRAST Result Date: 04/25/2023 CLINICAL DATA:  Abdominal pain, acute, nonlocalized EXAM: CT ABDOMEN AND PELVIS WITH CONTRAST TECHNIQUE: Multidetector CT imaging of the abdomen and pelvis was performed using the standard protocol following bolus administration of intravenous contrast. RADIATION DOSE REDUCTION: This exam was performed according to the departmental dose-optimization program which includes automated exposure control, adjustment of the mA and/or kV according to patient size and/or use of iterative reconstruction technique. CONTRAST:  OMNIPAQUE  IOHEXOL  300 MG/ML  SOLN COMPARISON:  Abdominal ultrasound dated March 04, 2023. CT chest dated January 17, 2022. FINDINGS: Lower chest: No acute abnormality. Similar 4 mm pulmonary nodules noted at the left lung base (series 302, image 5). Hepatobiliary: Diffusely decreased attenuation of the hepatic parenchyma, compatible with hepatic steatosis. Subcentimeter focal hypodensity at the right hepatic dome is too small to definitively characterize. No gallstones, gallbladder wall thickening, or biliary dilatation. Pancreas: Unremarkable. No pancreatic ductal dilatation or surrounding inflammatory changes. Spleen: Spleen is mildly enlarged measuring proximally 12.4 cm in craniocaudal dimension. No focal abnormality. Adrenals/Urinary Tract: Adrenal glands are unremarkable. Kidneys enhance symmetrically. Stable right renal cysts. A subcentimeter focal hypodensity in the interpolar left kidney is too small to definitively characterize. No suspicious focal lesion. No renal calculi or hydronephrosis. Bladder is unremarkable. Stomach/Bowel: Stomach is within normal limits. Appendix appears within normal limits. The small bowel is grossly unremarkable. No evidence of obstruction. Scattered sigmoid colonic diverticulosis without evidence of acute diverticulitis. There is a hypoattenuating peripherally enhancing collection abutting and adjacent to the posterior  margin of the rectum, measuring approximately 2.8 x 2.8 x 2.3 cm (AP by TR by cc) (series 301, image 88 and series 602, image 84). There is surrounding fat stranding. Vascular/Lymphatic: The abdominal aorta is normal in caliber. Atherosclerotic calcification of the abdominal aorta and its major branch vessels. No enlarged abdominal or pelvic lymph nodes. Reproductive: Prostate is unremarkable. Other: No abdominopelvic ascites. No intraperitoneal free air. No abdominal wall hernia. Musculoskeletal: No acute osseous abnormality. No suspicious osseous lesion. Multilevel degenerative disc changes of the lumbar spine, most pronounced at L4-L5 minimal grade 1 retrolisthesis of L4 on L5. IMPRESSION: 1. A peripherally enhancing collection, of indeterminate sterility, abutting and adjacent to the posterior margin of the rectum with surrounding fat stranding is concerning for a perirectal abscess. 2. Scattered sigmoid colonic diverticulosis without evidence of acute diverticulitis. 3. Hepatic steatosis. 4. Mild splenomegaly. 5.  Aortic Atherosclerosis (ICD10-I70.0). Electronically Signed   By: Harrietta Sherry M.D.   On: 04/25/2023 09:25    Procedures Procedures    Medications Ordered in ED Medications  piperacillin -tazobactam (ZOSYN ) IVPB 3.375 g (has no administration in time range)  piperacillin -tazobactam (ZOSYN ) IVPB 3.375 g (has no administration in time range)  fentaNYL  (SUBLIMAZE ) injection 50 mcg (has no administration in time range)  lidocaine  (XYLOCAINE ) 2 % jelly 1 Application (1 Application Urethral Given 04/25/23 0833)  iohexol  (OMNIPAQUE ) 300 MG/ML solution 100 mL (100 mLs Intravenous Contrast Given 04/25/23 0807)    ED Course/ Medical Decision Making/ A&P                                 Medical Decision Making Amount and/or Complexity of Data Reviewed Labs: ordered. Radiology: ordered.  Risk Prescription drug management.   Decorian Schuenemann is here with urinary retention abdominal pain and  may be constipation although he thinks constipation is gotten better.  Normal vitals.  No fever.  History of hypertension high cholesterol.  Differential diagnosis likely urinary retention from BPH, seems less likely to be bowel obstruction or fecal impaction at this time or other obstructive process but will get basic labs urinalysis CT scan abdomen pelvis.  Will do bladder scan and Place Foley catheter if greater than 100 cc of urine retained in the bladder.  Lab work for my review and interpretation shows mild leukocytosis of 13 but otherwise unremarkable labs.  No UTI.  He is feeling better after Foley catheter but still having a lot of rectal pain.  CT scan was ordered and does show rectal abscess 2.8 x 2.8 x 2.3 in the posterior margin of the rectum.  Externally I do not appreciate anything that is drainable.  Otherwise he is got some fat stranding around this.  I talked with Burnard Banter with general surgery who recommends medicine admission with IV antibiotics and they will decide if it is a surgical case or an IR case for drainage.  Patient admitted to medicine for further care.  This chart was dictated using voice recognition software.  Despite best efforts to proofread,  errors can occur which can change the documentation meaning.         Final Clinical Impression(s) / ED Diagnoses Final diagnoses:  Rectal abscess    Rx / DC Orders ED Discharge Orders     None         Ruthe Cornet, DO 04/25/23 9047

## 2023-04-25 NOTE — Op Note (Signed)
 04/25/2023  3:06 PM  PATIENT:  Daniel Blake  78 y.o. male  Patient Care Team: [facility] as PCP - General  PRE-OPERATIVE DIAGNOSIS:  Perirectal abscess  POST-OPERATIVE DIAGNOSIS:  Perirectal abscess  PROCEDURE:   Incision and drainage of perirectal abscess - posterior midline Anorectal exam under anesthesia  SURGEON:  Surgeon(s): Teresa Lonni HERO, MD   ANESTHESIA:   local and general  SPECIMEN:  No Specimen  DISPOSITION OF SPECIMEN:  N/A  COUNTS:  Sponge, needle, and instrument counts were reported correct x2 at conclusion.  EBL: 5 mL  PLAN OF CARE: Admit for overnight observation  PATIENT DISPOSITION:  PACU - hemodynamically stable.  OR FINDINGS: Posterior midline perirectal abscess containing ~10 cc of pus under pressure. 2 column internal hemorrhoids. Normal anal canal otherwise, no visible internal opening to suggest fistula  DESCRIPTION: The patient was identified in the preoperative holding area and taken to the OR where he was placed on the operating room table. SCDs were placed.  General anesthesia was induced without difficulty. The patient was then positioned in high lithotomy with Allen stirrups. Pressure points were then evaluated and padded.  He was then prepped and draped in usual sterile fashion.  A surgical timeout was performed indicating the correct patient, procedure, and positioning.  A perianal block was performed using a dilute mixture of 0.25% Marcaine  with epinephrine  and Exparel .   After ascertaining that an appropriate level of anesthesia had been achieved, a well lubricated digital rectal exam was performed. This demonstrated no palpable abnormalities aside from fullness in the posterior wall of the rectum.  A Hill-Ferguson anoscope was into the anal canal and circumferential inspection demonstrated healthy appearing anoderm.  2 column internal hemorrhoids.  No granulation tissue or obvious internal openings.  Externally, there  is a palpable firm fullness in the posterior midline.  Over the point adnexal fluctuance, radial incision is created in the posterior midline.  Blunt dissection is used to access the abscess cavity which is approximately a centimeter beneath the skin and underneath the external sphincter muscle.  This is in the superficial postanal space.  Tense abscess pocket is encountered we immediately drain at least 10 cc of purulent fluid.  The wound is then bluntly explored.  No other loculations are apparent.  The wound is irrigated.  Hemostasis is verified.  Half-inch iodoform gauze is then packed into the wound.  All sponge, needle, instrument counts were reported correct.  Additional local anesthetic was infiltrated at the I&D site at conclusion.  A dressing consisting of 4 x 4's, ABD, mesh under was ultimately placed.  He was then awakened from anesthesia, extubated, and transferred to a stretcher for transport to the recovery room in satisfactory condition.  DISPOSITION: PACU in satisfactory condition.

## 2023-04-25 NOTE — Transfer of Care (Signed)
 Immediate Anesthesia Transfer of Care Note  Patient: Daniel Blake  Procedure(s) Performed: IRRIGATION AND DEBRIDEMENT PERIRECTAL ABSCESS ANAL RECTAL EXAM UNDER ANESTHESIA  Patient Location: PACU  Anesthesia Type:General  Level of Consciousness: awake, alert , and oriented  Airway & Oxygen Therapy: Patient Spontanous Breathing  Post-op Assessment: Report given to RN, Post -op Vital signs reviewed and stable, Patient moving all extremities X 4, and Patient able to stick tongue midline  Post vital signs: Reviewed and stable  Last Vitals:  Vitals Value Taken Time  BP 151/76   Temp 98.6   Pulse 98   Resp 20   SpO2 95     Last Pain:  Vitals:   04/25/23 1342  TempSrc: Oral  PainSc:          Complications: No notable events documented.

## 2023-04-25 NOTE — H&P (Addendum)
 History and Physical    Patient: Daniel Blake DOB: [birth year] DOA: 04/25/2023 DOS: the patient was seen and examined on 04/25/2023 PCP: [facility]  Patient coming from: Home  Chief Complaint:  Chief Complaint  Patient presents with   Abdominal Pain   Rectal Pain   Urinary Retention   HPI: Daniel Blake is a 78 y.o. male with medical history significant of HTN, HLD admitted for concerns of perirectal abscess. Pt states he has been having issues with constipation and hemorrhoids. Four days PTA, took a laxative which resulted in a very large and painful bowel movement. Pt thought he had torn something. Pain was exquisite and he resorted to staying in a bathtub of warm water to ease the pain. Gradually, pt became weaker which prompted ED visit. Pt also reported concerns of being unable to empty the bladder.   In ED, pt was found to have WBC 13k. Foley was placed. Abd CT was notable for 2.8x2.8x2.3cm rectal abscess. EDP discussed with General Surgery who initially recommended medical admit for antibiotics. On arrival, pt was brought to OR for I/D of abscess.    Review of Systems: As mentioned in the history of present illness. All other systems reviewed and are negative. Past Medical History:  Diagnosis Date   Allergy    Chicken pox    GERD (gastroesophageal reflux disease)    Hyperlipidemia    Hypertension    PUD (peptic ulcer disease)    Past Surgical History:  Procedure Laterality Date   TONSILLECTOMY  1962   Social History:  reports that he quit smoking about 5 years ago. His smoking use included cigarettes. He has never used smokeless tobacco. He reports that he does not drink alcohol and does not use drugs.  Allergies  Allergen Reactions   Bee Venom Anaphylaxis   Amlodipine Other (See Comments)    Tired , foggy feeling  Tired , foggy feeling     Pravastatin Other (See Comments)    Myalgia Myalgia    Rosuvastatin Other (See Comments)     Family History  Problem Relation Age of Onset   Heart disease Mother    Hyperlipidemia Mother    Hypertension Mother    Asthma Father    COPD Father    Hyperlipidemia Father    Hypertension Father    Stroke Father    Arthritis Sister    Early death Sister    Heart disease Brother    Heart disease Brother    Heart attack Brother    Early death Brother     Prior to Admission medications   Medication Sig Start Date End Date Taking? Authorizing Provider  ALPRAZolam  (XANAX ) 0.25 MG tablet TAKE 1 TABLET BY MOUTH TWICE DAILY AS NEEDED FOR SLEEP OR ANXIETY 06/24/20   Webb, Padonda B, FNP  cholecalciferol  (VITAMIN D ) 1000 units tablet Take 1,000 Units by mouth daily.    [provider]  EPINEPHrine  0.3 mg/0.3 mL IJ SOAJ injection Inject 0.3 mLs (0.3 mg total) into the muscle as needed for anaphylaxis. INJECT 0.3ML INTO THE MUSCLE ONCE FOR ONE DOSE AS DIRECTED 07/11/19   Berneta Elsie Sayre, MD  ezetimibe  (ZETIA ) 10 MG tablet TAKE 1 TABLET BY MOUTH DAILY 06/24/20   Webb, Padonda B, FNP  tamsulosin  (FLOMAX ) 0.4 MG CAPS capsule TAKE 1 CAPSULE(0.4 MG) BY MOUTH DAILY 06/24/20   Webb, Padonda B, FNP  Testosterone  20.25 MG/1.25GM (1.62%) GEL Apply 1 spray on skin daily. 02/17/20   Nche, Roselie Rockford,  NP    Physical Exam: Vitals:   04/25/23 1630 04/25/23 1645 04/25/23 1700 04/25/23 1715  BP: 124/70 121/71 115/67 115/68  Pulse: 79 81 75 76  Resp: 12 16 12 16   Temp:  98.3 F (36.8 C)  98.3 F (36.8 C)  TempSrc:      SpO2: 93% 92% 96% 91%  Weight:      Height:       General exam: Awake, laying in bed, in nad Respiratory system: Normal respiratory effort, no wheezing Cardiovascular system: regular rate, s1, s2 Gastrointestinal system: Soft, nondistended, positive BS Central nervous system: CN2-12 grossly intact, strength intact Extremities: Perfused, no clubbing Skin: Normal skin turgor, no notable skin lesions seen Psychiatry: Mood normal // no visual hallucinations   Data  Reviewed:  Labs reviewed: Na 138, k 3.9, Cr 1.03, WBC 13.7, hgb 14.4  Assessment and Plan: Perirectal abscess -Not septic -General Surgery following. Pt is now s/p I/D 1/14 -Continued on zosyn  for now -analgesia as needed -recheck CBC in AM  HTN -BP stable and controlled  HLD -Stable -Would resume home lipid medication once confirmed by pharmacy  Bladder outlet obstruction -voiding trial when able   Advance Care Planning:   Code Status: Full Code Full  Consults: General Surgery  Family Communication: Pt in room, family not at bedside  Severity of Illness: The appropriate patient status for this patient is OBSERVATION. Observation status is judged to be reasonable and necessary in order to provide the required intensity of service to ensure the patient's safety. The patient's presenting symptoms, physical exam findings, and initial radiographic and laboratory data in the context of their medical condition is felt to place them at decreased risk for further clinical deterioration. Furthermore, it is anticipated that the patient will be medically stable for discharge from the hospital within 2 midnights of admission.   Author: Garnette Pelt, MD 04/25/2023 5:25 PM  For on call review [email or web address].

## 2023-04-25 NOTE — ED Notes (Signed)
 Foley catheter only had of urine return.

## 2023-04-25 NOTE — Anesthesia Procedure Notes (Signed)
 Procedure Name: LMA Insertion Date/Time: 04/25/2023 2:35 PM  Performed by: Harrold Macintosh, CRNAPre-anesthesia Checklist: Patient identified, Emergency Drugs available, Suction available, Patient being monitored and Timeout performed Patient Re-evaluated:Patient Re-evaluated prior to induction Oxygen Delivery Method: Circle system utilized Preoxygenation: Pre-oxygenation with 100% oxygen Induction Type: IV induction LMA: LMA inserted LMA Size: 5.0 Number of attempts: 1 Placement Confirmation: breath sounds checked- equal and bilateral and positive ETCO2 Tube secured with: Tape Dental Injury: Teeth and Oropharynx as per pre-operative assessment

## 2023-04-25 NOTE — Progress Notes (Signed)
 [facility] dayshift admitting team:  We are changing the room request to [facility].  Dr. Lonni Pizza request due to the current OR availability at this facility.  [facility] and PP RN notified.  Dr. Pizza will speak shortly to curling to arrange for the patient to go straight to the OR at [facility].  One of our team members will be assigned to his case once he is in the [facility] PACU area.  Alm Castor, MD.

## 2023-04-25 NOTE — Anesthesia Preprocedure Evaluation (Signed)
 Anesthesia Evaluation  Patient identified by MRN, date of birth, ID band Patient awake    Reviewed: Allergy & Precautions, NPO status , Patient's Chart, lab work & pertinent test results  History of Anesthesia Complications Negative for: history of anesthetic complications  Airway Mallampati: II  TM Distance: >3 FB Neck ROM: Full    Dental  (+) Dental Advisory Given, Teeth Intact   Pulmonary former smoker   breath sounds clear to auscultation       Cardiovascular hypertension,  Rhythm:Regular     Neuro/Psych negative neurological ROS     GI/Hepatic PUD,GERD  Controlled,,  Endo/Other  negative endocrine ROS    Renal/GU Lab Results      Component                Value               Date                      NA                       138                 04/25/2023                K                        3.9                 04/25/2023                CO2                      25                  04/25/2023                GLUCOSE                  107 (H)             04/25/2023                BUN                      13                  04/25/2023                CREATININE               1.03                04/25/2023                CALCIUM                  8.9                 04/25/2023                GFR                      68.09               07/11/2019                GFRNONAA                 >  60                 04/25/2023                Musculoskeletal negative musculoskeletal ROS (+)    Abdominal   Peds  Hematology negative hematology ROS (+)   Anesthesia Other Findings   Reproductive/Obstetrics                             Anesthesia Physical Anesthesia Plan  ASA: 2  Anesthesia Plan: General   Post-op Pain Management:    Induction: Intravenous  PONV Risk Score and Plan: 2 and Ondansetron  and Dexamethasone  Airway Management Planned: LMA and Oral ETT  Additional Equipment:  None  Intra-op Plan:   Post-operative Plan: Extubation in OR  Informed Consent: I have reviewed the patients History and Physical, chart, labs and discussed the procedure including the risks, benefits and alternatives for the proposed anesthesia with the patient or authorized representative who has indicated his/her understanding and acceptance.     Dental advisory given  Plan Discussed with: CRNA  Anesthesia Plan Comments:        Anesthesia Quick Evaluation

## 2023-04-25 NOTE — ED Triage Notes (Signed)
 Pt with last BM 4 days ago. Pt states he became constipated after taking a medication he cannot recall. Pt states that the BM hurt his rectum and feels like it tore something. Pt now has rectal pain and thinks his hemorrhoids are flaring. Pt also feels like he has urinary retention. Unable to empty bladder. Reports last time he urinated incompletely was at 0200. Pt cannot sit in triage and appears in distress.

## 2023-04-25 NOTE — Progress Notes (Signed)
 Plan of Care Note for accepted transfer   Patient: Daniel Blake MRN: [id number]   DOA: 04/25/2023  Facility requesting transfer: [facility].  Requesting Provider: Juliene Bicker, DO. Reason for transfer: Perirectal abscess. Facility course:  Patient here with urinary retention.  History of high cholesterol hypertension.  He feels full in his belly.  Symptoms of constipation as well.  He has been taking Flomax  prescribed by urology as he is been dealing with some bladder issues here recently.  He denies any nausea or vomiting.  He feels like when he had a bowel movement he heard something on his abdomen.  Denies any weakness numbness tingling.  Denies any fever or chills.  Denies any chest pain shortness of breath.  Lab work: Urinalysis, Microscopic (reflex) [[id number]] (Abnormal)   Collected: 04/25/23 0627   Updated: 04/25/23 0934    RBC / HPF 6-10 RBC/hpf   WBC, UA 0-5 WBC/hpf   Bacteria, UA RARE Abnormal    Squamous Epithelial / HPF 0-5 /HPF  Urinalysis, Routine w reflex microscopic -Urine, Clean Catch [[id number]] (Abnormal)   Collected: 04/25/23 0627   Updated: 04/25/23 0933   Specimen Source: Urine, Clean Catch    Color, Urine YELLOW   APPearance CLEAR   Specific Gravity, Urine 1.010   pH 5.5   Glucose, UA NEGATIVE mg/dL   Hgb urine dipstick MODERATE Abnormal    Bilirubin Urine NEGATIVE   Ketones, ur NEGATIVE mg/dL   Protein, ur NEGATIVE mg/dL   Nitrite NEGATIVE   Leukocytes,Ua NEGATIVE  Urine Culture [[id number]]   Collected: 04/25/23 0853   Updated: 04/25/23 0929   Specimen Source: Urine, Clean Catch   Hepatic function panel [[id number]]   Collected: 04/25/23 0726   Updated: 04/25/23 0754   Specimen Type: Blood   Specimen Source: Vein    Total Protein 7.3 g/dL   Albumin 4.0 g/dL   AST 16 U/L   ALT 16 U/L   Alkaline Phosphatase 45 U/L   Total Bilirubin 0.7 mg/dL   Bilirubin, Direct 0.1 mg/dL   Indirect Bilirubin 0.6 mg/dL  Basic metabolic panel [[id number]]  (Abnormal)   Collected: 04/25/23 0726   Updated: 04/25/23 0747   Specimen Type: Blood   Specimen Source: Vein    Sodium 138 mmol/L   Potassium 3.9 mmol/L   Chloride 105 mmol/L   CO2 25 mmol/L   Glucose, Bld 107 High  mg/dL   BUN 13 mg/dL   Creatinine, Ser 8.96 mg/dL   Calcium 8.9 mg/dL   GFR, Estimated >39 mL/min   Anion gap 8  CBC with Differential [[id number]] (Abnormal)   Collected: 04/25/23 0726   Updated: 04/25/23 0732   Specimen Type: Blood   Specimen Source: Vein    WBC 13.7 High  K/uL   RBC 4.67 MIL/uL   Hemoglobin 14.4 g/dL   HCT 57.0 %   MCV 08.0 fL   MCH 30.8 pg   MCHC 33.6 g/dL   RDW 88.2 %   Platelets 303 K/uL   nRBC 0.0 %   Neutrophils Relative % 77 %   Neutro Abs 10.5 High  K/uL   Lymphocytes Relative 13 %   Lymphs Abs 1.8 K/uL   Monocytes Relative 9 %   Monocytes Absolute 1.3 High  K/uL   Eosinophils Relative 1 %   Eosinophils Absolute 0.1 K/uL   Basophils Relative 0 %   Basophils Absolute 0.0 K/uL   Immature Granulocytes 0 %   Abs Immature Granulocytes 0.05 K/uL   Imaging:  CT abdomen/pelvis  with contrast  COMPARISON:  Abdominal ultrasound dated March 04, 2023. CT chest dated January 17, 2022.   FINDINGS: Lower chest: No acute abnormality. Similar 4 mm pulmonary nodules noted at the left lung base (series 302, image 5).   Hepatobiliary: Diffusely decreased attenuation of the hepatic parenchyma, compatible with hepatic steatosis. Subcentimeter focal hypodensity at the right hepatic dome is too small to definitively characterize. No gallstones, gallbladder wall thickening, or biliary dilatation.   Pancreas: Unremarkable. No pancreatic ductal dilatation or surrounding inflammatory changes.   Spleen: Spleen is mildly enlarged measuring proximally 12.4 cm in craniocaudal dimension. No focal abnormality.   Adrenals/Urinary Tract: Adrenal glands are unremarkable. Kidneys enhance symmetrically. Stable right renal cysts. A  subcentimeter focal hypodensity in the interpolar left kidney is too small to definitively characterize. No suspicious focal lesion. No renal calculi or hydronephrosis. Bladder is unremarkable.   Stomach/Bowel: Stomach is within normal limits. Appendix appears within normal limits. The small bowel is grossly unremarkable. No evidence of obstruction. Scattered sigmoid colonic diverticulosis without evidence of acute diverticulitis. There is a hypoattenuating peripherally enhancing collection abutting and adjacent to the posterior margin of the rectum, measuring approximately 2.8 x 2.8 x 2.3 cm (AP by TR by cc) (series 301, image 88 and series 602, image 84). There is surrounding fat stranding.   Vascular/Lymphatic: The abdominal aorta is normal in caliber. Atherosclerotic calcification of the abdominal aorta and its major branch vessels. No enlarged abdominal or pelvic lymph nodes.   Reproductive: Prostate is unremarkable.   Other: No abdominopelvic ascites. No intraperitoneal free air. No abdominal wall hernia.   Musculoskeletal: No acute osseous abnormality. No suspicious osseous lesion. Multilevel degenerative disc changes of the lumbar spine, most pronounced at L4-L5 minimal grade 1 retrolisthesis of L4 on L5.   IMPRESSION: 1. A peripherally enhancing collection, of indeterminate sterility, abutting and adjacent to the posterior margin of the rectum with surrounding fat stranding is concerning for a perirectal abscess. 2. Scattered sigmoid colonic diverticulosis without evidence of acute diverticulitis. 3. Hepatic steatosis. 4. Mild splenomegaly. 5.  Aortic Atherosclerosis (ICD10-I70.0).     Electronically Signed   By: Harrietta Sherry M.D.   On: 04/25/2023 09:25  Plan of care: The patient is accepted for admission to Med-surg  unit, at [facility].  The patient was started on Zosyn .  General surgery consulted by EDP.  Please notify when the patient arrives to  the hospital.  General surgery will determine if these can be drained by IR.  Author: Alm Dorn Castor, MD 04/25/2023  Check [email or web address] for on-call coverage.  Nursing staff, Please call [facility] Admits & Consults System-Wide number on Amion as soon as patient's arrival, so appropriate admitting provider can evaluate the pt.

## 2023-04-25 NOTE — Consult Note (Signed)
 Requesting MD: Dr. Alm Castor Chief Complaint/Reason for Consult: perirectal abscess  HPI: Kaleem Sartwell is an 78 y.o. male with a history of HLD and HTN who presents to the [facility] ED with urinary retention.  He has been taking flomax  due to recent bladder issues that he is followed by urology for.  He is also having rectal pain.  After foley placed his abdomen was feeling better, but still having a significant amount of rectal pain.  He underwent a CT scan that revealed a posterior perirectal abscess.  He is being admitted by the medical service and we have been asked to see him for management of this fluid collection.   He reports 4d hx of progressive perianal pain. Denies history of this before. Denies history of perirectal abscesses in the past. Does have intermittent constipation at home, taking metamucil at present.   He is here today by himself but his wife is on her way  Past Medical History:  Diagnosis Date   Allergy    Chicken pox    GERD (gastroesophageal reflux disease)    Hyperlipidemia    Hypertension    PUD (peptic ulcer disease)     Past Surgical History:  Procedure Laterality Date   TONSILLECTOMY  1962    Family History  Problem Relation Age of Onset   Heart disease Mother    Hyperlipidemia Mother    Hypertension Mother    Asthma Father    COPD Father    Hyperlipidemia Father    Hypertension Father    Stroke Father    Arthritis Sister    Early death Sister    Heart disease Brother    Heart disease Brother    Heart attack Brother    Early death Brother     Social:  reports that he quit smoking about 5 years ago. His smoking use included cigarettes. He has never used smokeless tobacco. He reports that he does not drink alcohol and does not use drugs.  Allergies:  Allergies  Allergen Reactions   Bee Venom Anaphylaxis   Amlodipine Other (See Comments)    Tired , foggy feeling  Tired , foggy feeling     Pravastatin Other (See Comments)     Myalgia Myalgia    Rosuvastatin Other (See Comments)    Medications: I have reviewed the patient's current medications.  Results for orders placed or performed during the hospital encounter of 04/25/23 (from the past 48 hours)  Urinalysis, Routine w reflex microscopic -Urine, Clean Catch     Status: Abnormal   Collection Time: 04/25/23  6:27 AM  Result Value Ref Range   Color, Urine YELLOW YELLOW   APPearance CLEAR CLEAR   Specific Gravity, Urine 1.010 1.005 - 1.030   pH 5.5 5.0 - 8.0   Glucose, UA NEGATIVE NEGATIVE mg/dL   Hgb urine dipstick MODERATE (A) NEGATIVE   Bilirubin Urine NEGATIVE NEGATIVE   Ketones, ur NEGATIVE NEGATIVE mg/dL   Protein, ur NEGATIVE NEGATIVE mg/dL   Nitrite NEGATIVE NEGATIVE   Leukocytes,Ua NEGATIVE NEGATIVE    Comment: Performed at [facility], [address] [place], [place] [address]  Urinalysis, Microscopic (reflex)     Status: Abnormal   Collection Time: 04/25/23  6:27 AM  Result Value Ref Range   RBC / HPF 6-10 0 - 5 RBC/hpf   WBC, UA 0-5 0 - 5 WBC/hpf   Bacteria, UA RARE (A) NONE SEEN   Squamous Epithelial / HPF 0-5 0 - 5 /HPF  Comment: Performed at [facility], [address] [place], [place] [address]  CBC with Differential     Status: Abnormal   Collection Time: 04/25/23  7:26 AM  Result Value Ref Range   WBC 13.7 (H) 4.0 - 10.5 K/uL   RBC 4.67 4.22 - 5.81 MIL/uL   Hemoglobin 14.4 13.0 - 17.0 g/dL   HCT 57.0 60.9 - 47.9 %   MCV 91.9 80.0 - 100.0 fL   MCH 30.8 26.0 - 34.0 pg   MCHC 33.6 30.0 - 36.0 g/dL   RDW 88.2 88.4 - 84.4 %   Platelets 303 150 - 400 K/uL   nRBC 0.0 0.0 - 0.2 %   Neutrophils Relative % 77 %   Neutro Abs 10.5 (H) 1.7 - 7.7 K/uL   Lymphocytes Relative 13 %   Lymphs Abs 1.8 0.7 - 4.0 K/uL   Monocytes Relative 9 %   Monocytes Absolute 1.3 (H) 0.1 - 1.0 K/uL   Eosinophils Relative 1 %   Eosinophils Absolute 0.1 0.0 - 0.5 K/uL   Basophils Relative 0 %   Basophils Absolute 0.0 0.0 - 0.1  K/uL   Immature Granulocytes 0 %   Abs Immature Granulocytes 0.05 0.00 - 0.07 K/uL    Comment: Performed at [facility], [address] [place], [place] [address]  Basic metabolic panel     Status: Abnormal   Collection Time: 04/25/23  7:26 AM  Result Value Ref Range   Sodium 138 135 - 145 mmol/L   Potassium 3.9 3.5 - 5.1 mmol/L   Chloride 105 98 - 111 mmol/L   CO2 25 22 - 32 mmol/L   Glucose, Bld 107 (H) 70 - 99 mg/dL    Comment: Glucose reference range applies only to samples taken after fasting for at least 8 hours.   BUN 13 8 - 23 mg/dL   Creatinine, Ser 8.96 0.61 - 1.24 mg/dL   Calcium 8.9 8.9 - 89.6 mg/dL   GFR, Estimated >39 >39 mL/min    Comment: (NOTE) Calculated using the CKD-EPI Creatinine Equation (2021)    Anion gap 8 5 - 15    Comment: Performed at [facility], [address] [place], [place] [address]  Hepatic function panel     Status: None   Collection Time: 04/25/23  7:26 AM  Result Value Ref Range   Total Protein 7.3 6.5 - 8.1 g/dL   Albumin 4.0 3.5 - 5.0 g/dL   AST 16 15 - 41 U/L   ALT 16 0 - 44 U/L   Alkaline Phosphatase 45 38 - 126 U/L   Total Bilirubin 0.7 0.0 - 1.2 mg/dL   Bilirubin, Direct 0.1 0.0 - 0.2 mg/dL   Indirect Bilirubin 0.6 0.3 - 0.9 mg/dL    Comment: Performed at [facility], [address] [place], [place] [address]    CT ABDOMEN PELVIS W CONTRAST Result Date: 04/25/2023 CLINICAL DATA:  Abdominal pain, acute, nonlocalized EXAM: CT ABDOMEN AND PELVIS WITH CONTRAST TECHNIQUE: Multidetector CT imaging of the abdomen and pelvis was performed using the standard protocol following bolus administration of intravenous contrast. RADIATION DOSE REDUCTION: This exam was performed according to the departmental dose-optimization program which includes automated exposure control, adjustment of the mA and/or kV according to patient size and/or use of iterative reconstruction technique. CONTRAST:  OMNIPAQUE  IOHEXOL   300 MG/ML  SOLN COMPARISON:  Abdominal ultrasound dated March 04, 2023. CT chest dated January 17, 2022. FINDINGS: Lower chest: No acute abnormality. Similar 4 mm pulmonary nodules noted at the left lung base (series 302, image 5). Hepatobiliary: Diffusely decreased attenuation of the hepatic parenchyma, compatible with hepatic steatosis. Subcentimeter focal hypodensity at the right hepatic dome is too small to definitively characterize. No gallstones, gallbladder wall thickening, or biliary dilatation. Pancreas: Unremarkable. No pancreatic ductal dilatation or surrounding inflammatory changes. Spleen: Spleen is mildly enlarged measuring proximally 12.4 cm in craniocaudal dimension. No focal abnormality. Adrenals/Urinary Tract: Adrenal glands are unremarkable. Kidneys enhance symmetrically. Stable right renal cysts. A subcentimeter focal hypodensity in the interpolar left kidney is too small to definitively characterize. No suspicious focal lesion. No renal calculi or hydronephrosis. Bladder is unremarkable. Stomach/Bowel: Stomach is within normal limits. Appendix appears within normal limits. The small bowel is grossly unremarkable. No evidence of obstruction. Scattered sigmoid colonic diverticulosis without evidence of acute diverticulitis. There is a hypoattenuating peripherally enhancing collection abutting and adjacent to the posterior margin of the rectum, measuring approximately 2.8 x 2.8 x 2.3 cm (AP by TR by cc) (series 301, image 88 and series 602, image 84). There is surrounding fat stranding. Vascular/Lymphatic: The abdominal aorta is normal in caliber. Atherosclerotic calcification of the abdominal aorta and its major branch vessels. No enlarged abdominal or pelvic lymph nodes. Reproductive: Prostate is unremarkable. Other: No abdominopelvic ascites. No intraperitoneal free air. No abdominal wall hernia. Musculoskeletal: No acute osseous abnormality. No suspicious osseous lesion. Multilevel  degenerative disc changes of the lumbar spine, most pronounced at L4-L5 minimal grade 1 retrolisthesis of L4 on L5. IMPRESSION: 1. A peripherally enhancing collection, of indeterminate sterility, abutting and adjacent to the posterior margin of the rectum with surrounding fat stranding is concerning for a perirectal abscess. 2. Scattered sigmoid colonic diverticulosis without evidence of acute diverticulitis. 3. Hepatic steatosis. 4. Mild splenomegaly. 5.  Aortic Atherosclerosis (ICD10-I70.0). Electronically Signed   By: Harrietta Sherry M.D.   On: 04/25/2023 09:25    ROS - all of the below systems have been reviewed with the patient and positives are indicated with bold text General: chills, fever or night sweats Eyes: blurry vision or double vision ENT: epistaxis or sore throat Allergy/Immunology: itchy/watery eyes or nasal congestion Hematologic/Lymphatic: bleeding problems, blood clots or swollen lymph nodes Endocrine: temperature intolerance or unexpected weight changes Breast: new or changing breast lumps or nipple discharge Resp: cough, shortness of breath, or wheezing CV: chest pain or dyspnea on exertion GI: as per HPI GU: dysuria, trouble voiding, or hematuria MSK: joint pain or joint stiffness Neuro: TIA or stroke symptoms Derm: pruritus and skin lesion changes Psych: anxiety and depression  PE Blood pressure 126/75, pulse 78, temperature 99.2 F (37.3 C), temperature source Oral, resp. rate 18, height 6' 1 (1.854 m), weight 90.7 kg, SpO2 97%. Constitutional: NAD; conversant; no deformities Eyes: Moist conjunctiva; no lid lag; anicteric; PERRL Neck: Trachea midline; no thyromegaly Lungs: Normal respiratory effort; no tactile fremitus CV: RRR; no palpable thrills; no pitting edema GI: Abd soft, NT/ND; no palpable hepatosplenomegaly MSK: Normal range of motion of extremities; no clubbing/cyanosis Psychiatric: Appropriate affect; alert and oriented x3 Lymphatic: No palpable  cervical or axillary lymphadenopathy  Results for orders placed or performed during the hospital encounter of 04/25/23 (from the past 48 hours)  Urinalysis, Routine w reflex microscopic -Urine, Clean Catch     Status: Abnormal   Collection Time: 04/25/23  6:27 AM  Result Value Ref Range   Color, Urine YELLOW YELLOW   APPearance CLEAR CLEAR   Specific Gravity,  Urine 1.010 1.005 - 1.030   pH 5.5 5.0 - 8.0   Glucose, UA NEGATIVE NEGATIVE mg/dL   Hgb urine dipstick MODERATE (A) NEGATIVE   Bilirubin Urine NEGATIVE NEGATIVE   Ketones, ur NEGATIVE NEGATIVE mg/dL   Protein, ur NEGATIVE NEGATIVE mg/dL   Nitrite NEGATIVE NEGATIVE   Leukocytes,Ua NEGATIVE NEGATIVE    Comment: Performed at [facility] [address] [place], [place] [address]  Urinalysis, Microscopic (reflex)     Status: Abnormal   Collection Time: 04/25/23  6:27 AM  Result Value Ref Range   RBC / HPF 6-10 0 - 5 RBC/hpf   WBC, UA 0-5 0 - 5 WBC/hpf   Bacteria, UA RARE (A) NONE SEEN   Squamous Epithelial / HPF 0-5 0 - 5 /HPF    Comment: Performed at [facility], [address] [place], [place] [address]  CBC with Differential     Status: Abnormal   Collection Time: 04/25/23  7:26 AM  Result Value Ref Range   WBC 13.7 (H) 4.0 - 10.5 K/uL   RBC 4.67 4.22 - 5.81 MIL/uL   Hemoglobin 14.4 13.0 - 17.0 g/dL   HCT 57.0 60.9 - 47.9 %   MCV 91.9 80.0 - 100.0 fL   MCH 30.8 26.0 - 34.0 pg   MCHC 33.6 30.0 - 36.0 g/dL   RDW 88.2 88.4 - 84.4 %   Platelets 303 150 - 400 K/uL   nRBC 0.0 0.0 - 0.2 %   Neutrophils Relative % 77 %   Neutro Abs 10.5 (H) 1.7 - 7.7 K/uL   Lymphocytes Relative 13 %   Lymphs Abs 1.8 0.7 - 4.0 K/uL   Monocytes Relative 9 %   Monocytes Absolute 1.3 (H) 0.1 - 1.0 K/uL   Eosinophils Relative 1 %   Eosinophils Absolute 0.1 0.0 - 0.5 K/uL   Basophils Relative 0 %   Basophils Absolute 0.0 0.0 - 0.1 K/uL   Immature Granulocytes 0 %   Abs Immature Granulocytes 0.05 0.00 - 0.07 K/uL     Comment: Performed at [facility], [address] [place], [place] [address]  Basic metabolic panel     Status: Abnormal   Collection Time: 04/25/23  7:26 AM  Result Value Ref Range   Sodium 138 135 - 145 mmol/L   Potassium 3.9 3.5 - 5.1 mmol/L   Chloride 105 98 - 111 mmol/L   CO2 25 22 - 32 mmol/L   Glucose, Bld 107 (H) 70 - 99 mg/dL    Comment: Glucose reference range applies only to samples taken after fasting for at least 8 hours.   BUN 13 8 - 23 mg/dL   Creatinine, Ser 8.96 0.61 - 1.24 mg/dL   Calcium 8.9 8.9 - 89.6 mg/dL   GFR, Estimated >39 >39 mL/min    Comment: (NOTE) Calculated using the CKD-EPI Creatinine Equation (2021)    Anion gap 8 5 - 15    Comment: Performed at [facility], [address] [place], [place] [address]  Hepatic function panel     Status: None   Collection Time: 04/25/23  7:26 AM  Result Value Ref Range   Total Protein 7.3 6.5 - 8.1 g/dL   Albumin 4.0 3.5 - 5.0 g/dL   AST 16 15 - 41 U/L   ALT 16 0 - 44 U/L   Alkaline Phosphatase 45 38 - 126 U/L   Total Bilirubin 0.7 0.0 - 1.2 mg/dL  Bilirubin, Direct 0.1 0.0 - 0.2 mg/dL   Indirect Bilirubin 0.6 0.3 - 0.9 mg/dL    Comment: Performed at [facility], [address] [place], [place] [address]    CT ABDOMEN PELVIS W CONTRAST Result Date: 04/25/2023 CLINICAL DATA:  Abdominal pain, acute, nonlocalized EXAM: CT ABDOMEN AND PELVIS WITH CONTRAST TECHNIQUE: Multidetector CT imaging of the abdomen and pelvis was performed using the standard protocol following bolus administration of intravenous contrast. RADIATION DOSE REDUCTION: This exam was performed according to the departmental dose-optimization program which includes automated exposure control, adjustment of the mA and/or kV according to patient size and/or use of iterative reconstruction technique. CONTRAST:  OMNIPAQUE  IOHEXOL  300 MG/ML  SOLN COMPARISON:  Abdominal ultrasound dated March 04, 2023. CT chest  dated January 17, 2022. FINDINGS: Lower chest: No acute abnormality. Similar 4 mm pulmonary nodules noted at the left lung base (series 302, image 5). Hepatobiliary: Diffusely decreased attenuation of the hepatic parenchyma, compatible with hepatic steatosis. Subcentimeter focal hypodensity at the right hepatic dome is too small to definitively characterize. No gallstones, gallbladder wall thickening, or biliary dilatation. Pancreas: Unremarkable. No pancreatic ductal dilatation or surrounding inflammatory changes. Spleen: Spleen is mildly enlarged measuring proximally 12.4 cm in craniocaudal dimension. No focal abnormality. Adrenals/Urinary Tract: Adrenal glands are unremarkable. Kidneys enhance symmetrically. Stable right renal cysts. A subcentimeter focal hypodensity in the interpolar left kidney is too small to definitively characterize. No suspicious focal lesion. No renal calculi or hydronephrosis. Bladder is unremarkable. Stomach/Bowel: Stomach is within normal limits. Appendix appears within normal limits. The small bowel is grossly unremarkable. No evidence of obstruction. Scattered sigmoid colonic diverticulosis without evidence of acute diverticulitis. There is a hypoattenuating peripherally enhancing collection abutting and adjacent to the posterior margin of the rectum, measuring approximately 2.8 x 2.8 x 2.3 cm (AP by TR by cc) (series 301, image 88 and series 602, image 84). There is surrounding fat stranding. Vascular/Lymphatic: The abdominal aorta is normal in caliber. Atherosclerotic calcification of the abdominal aorta and its major branch vessels. No enlarged abdominal or pelvic lymph nodes. Reproductive: Prostate is unremarkable. Other: No abdominopelvic ascites. No intraperitoneal free air. No abdominal wall hernia. Musculoskeletal: No acute osseous abnormality. No suspicious osseous lesion. Multilevel degenerative disc changes of the lumbar spine, most pronounced at L4-L5 minimal grade 1  retrolisthesis of L4 on L5. IMPRESSION: 1. A peripherally enhancing collection, of indeterminate sterility, abutting and adjacent to the posterior margin of the rectum with surrounding fat stranding is concerning for a perirectal abscess. 2. Scattered sigmoid colonic diverticulosis without evidence of acute diverticulitis. 3. Hepatic steatosis. 4. Mild splenomegaly. 5.  Aortic Atherosclerosis (ICD10-I70.0). Electronically Signed   By: Harrietta Sherry M.D.   On: 04/25/2023 09:25    A/P: Nasiah Lehenbauer is an 78 y.o. male whom appears to have a perirectal abscess that would benefit from incision and drainage.  We have discussed this procedure and he is agreeable to proceed.  He is being admitted by the medical service for further management of his urinary retention as well.     -The anatomy and physiology of the anal canal was discussed with the patient. The pathophysiology of anal abscess/fistula was discussed as well, working to provide a good understanding. -We have reviewed options going forward, recommending incision/drainage of perirectal abscess, anorectal exam under anesthesia -The planned procedure, material risks (including, but not limited to, pain, bleeding, infection, scarring, need for blood transfusion, damage to anal sphincter, incontinence of gas and/or stool, need for additional  procedures, recurrence, pneumonia, heart attack, stroke, death) benefits and alternatives to surgery were discussed at length. I noted a good probability that the procedure would help improve their symptoms. The patient's questions were answered to his satisfaction, he voiced understanding and elected to proceed with surgery. Additionally, we discussed typical postoperative expectations and the recovery process.   FEN - NPO for OR, may eat to follow VTE - may have chemical prophylaxis from our standpoint ID - [email or web address]  Admit - by hospitalist   Urinary retention HTN GERD HLD   High medical decision  making  Lonni Pizza, MD [facility]

## 2023-04-26 ENCOUNTER — Encounter (HOSPITAL_COMMUNITY): Payer: Self-pay | Admitting: Surgery

## 2023-04-26 DIAGNOSIS — R3912 Poor urinary stream: Secondary | ICD-10-CM

## 2023-04-26 DIAGNOSIS — N401 Enlarged prostate with lower urinary tract symptoms: Secondary | ICD-10-CM

## 2023-04-26 DIAGNOSIS — K219 Gastro-esophageal reflux disease without esophagitis: Secondary | ICD-10-CM

## 2023-04-26 DIAGNOSIS — K5900 Constipation, unspecified: Secondary | ICD-10-CM | POA: Diagnosis not present

## 2023-04-26 DIAGNOSIS — K2100 Gastro-esophageal reflux disease with esophagitis, without bleeding: Secondary | ICD-10-CM

## 2023-04-26 DIAGNOSIS — K5909 Other constipation: Secondary | ICD-10-CM

## 2023-04-26 DIAGNOSIS — K611 Rectal abscess: Secondary | ICD-10-CM | POA: Diagnosis not present

## 2023-04-26 LAB — COMPREHENSIVE METABOLIC PANEL
ALT: 13 U/L (ref 0–44)
AST: 17 U/L (ref 15–41)
Albumin: 3.2 g/dL — ABNORMAL LOW (ref 3.5–5.0)
Alkaline Phosphatase: 42 U/L (ref 38–126)
Anion gap: 9 (ref 5–15)
BUN: 11 mg/dL (ref 8–23)
CO2: 26 mmol/L (ref 22–32)
Calcium: 8.2 mg/dL — ABNORMAL LOW (ref 8.9–10.3)
Chloride: 102 mmol/L (ref 98–111)
Creatinine, Ser: 1.07 mg/dL (ref 0.61–1.24)
GFR, Estimated: >60 mL/min (ref >60)
Glucose, Bld: 112 mg/dL — ABNORMAL HIGH (ref 70–99)
Potassium: 4 mmol/L (ref 3.5–5.1)
Sodium: 137 mmol/L (ref 135–145)
Total Bilirubin: 1 mg/dL (ref 0.0–1.2)
Total Protein: 6.3 g/dL — ABNORMAL LOW (ref 6.5–8.1)

## 2023-04-26 LAB — CBC
HCT: 39.1 % (ref 39.0–52.0)
Hemoglobin: 13.1 g/dL (ref 13.0–17.0)
MCH: 31 pg (ref 26.0–34.0)
MCHC: 33.5 g/dL (ref 30.0–36.0)
MCV: 92.7 fL (ref 80.0–100.0)
Platelets: 309 10*3/uL (ref 150–400)
RBC: 4.22 MIL/uL (ref 4.22–5.81)
RDW: 11.8 % (ref 11.5–15.5)
WBC: 11 10*3/uL — ABNORMAL HIGH (ref 4.0–10.5)
nRBC: 0 % (ref 0.0–0.2)

## 2023-04-26 LAB — URINE CULTURE
Culture: NO GROWTH
Special Requests: NORMAL

## 2023-04-26 MED ORDER — CHLORHEXIDINE GLUCONATE CLOTH 2 % EX PADS
6.0000 | MEDICATED_PAD | Freq: Every day | CUTANEOUS | Status: DC
  Administered 2023-04-26 – 2023-04-27 (×2): 6 via TOPICAL

## 2023-04-26 MED ORDER — SENNOSIDES-DOCUSATE SODIUM 8.6-50 MG PO TABS
1.0000 | ORAL_TABLET | Freq: Two times a day (BID) | ORAL | Status: DC
  Administered 2023-04-26 – 2023-04-27 (×2): 1 via ORAL
  Filled 2023-04-26 (×2): qty 1

## 2023-04-26 MED ORDER — POLYETHYLENE GLYCOL 3350 17 G PO PACK
17.0000 g | PACK | Freq: Two times a day (BID) | ORAL | Status: DC
  Administered 2023-04-26 – 2023-04-27 (×2): 17 g via ORAL
  Filled 2023-04-26 (×2): qty 1

## 2023-04-26 MED ORDER — VITAMIN B-12 100 MCG PO TABS
100.0000 ug | ORAL_TABLET | Freq: Every day | ORAL | Status: DC
  Administered 2023-04-27: 100 ug via ORAL
  Filled 2023-04-26 (×2): qty 1

## 2023-04-26 MED ORDER — POLYETHYLENE GLYCOL 3350 17 G PO PACK: 17.0000 g | PACK | Freq: Two times a day (BID) | ORAL | Status: DC

## 2023-04-26 MED ORDER — MORPHINE SULFATE (PF) 2 MG/ML IV SOLN
2.0000 mg | Freq: Once | INTRAVENOUS | Status: AC
  Administered 2023-04-26: 2 mg via INTRAVENOUS
  Filled 2023-04-26: qty 1

## 2023-04-26 MED ORDER — AMOXICILLIN-POT CLAVULANATE 875-125 MG PO TABS
1.0000 | ORAL_TABLET | Freq: Two times a day (BID) | ORAL | Status: DC
  Administered 2023-04-26 – 2023-04-27 (×2): 1 via ORAL
  Filled 2023-04-26 (×2): qty 1

## 2023-04-26 MED ORDER — PANTOPRAZOLE SODIUM 40 MG PO TBEC
40.0000 mg | DELAYED_RELEASE_TABLET | Freq: Every day | ORAL | Status: DC
  Administered 2023-04-26 – 2023-04-27 (×2): 40 mg via ORAL
  Filled 2023-04-26 (×2): qty 1

## 2023-04-26 MED ORDER — VITAMIN D 25 MCG (1000 UNIT) PO TABS
1000.0000 [IU] | ORAL_TABLET | Freq: Every day | ORAL | Status: DC
  Filled 2023-04-26 (×2): qty 1

## 2023-04-26 MED ORDER — OMEGA-3-ACID ETHYL ESTERS 1 G PO CAPS
1.0000 g | ORAL_CAPSULE | Freq: Every day | ORAL | Status: DC
  Filled 2023-04-26 (×2): qty 1

## 2023-04-26 NOTE — TOC Initial Note (Signed)
 Transition of Care [facility]) - Initial/Assessment Note    Patient Details  Name: Daniel Blake MRN: [id number] Date of Birth: Oct 01, 1945  Transition of [facility] CM/SW Contact:    Omie Bickers, RN Phone Number: 04/26/2023, 8:13 AM  Clinical Narrative:                  Patient admitted from home, lives w wife.  Sx for perirectal abscess 1/14. Con't on IV Abx.  TOC available for DC assistance as needed, will follow through progression rounds.  Expected Discharge Plan: Home/Self Care Barriers to Discharge: Continued Medical Work up   Patient Goals and CMS Choice            Expected Discharge Plan and Services       Living arrangements for the past 2 months: Single Family Home                                      Prior Living Arrangements/Services Living arrangements for the past 2 months: Single Family Home Lives with:: Spouse                   Activities of Daily Living   ADL Screening (condition at time of admission) Independently performs ADLs?: Yes (appropriate for developmental age) Is the patient deaf or have difficulty hearing?: No Does the patient have difficulty seeing, even when wearing glasses/contacts?: No Does the patient have difficulty concentrating, remembering, or making decisions?: No  Permission Sought/Granted                  Emotional Assessment              Admission diagnosis:  Perirectal abscess [K61.1] Rectal abscess [K61.1] Peri-rectal abscess [K61.1] Patient Active Problem List   Diagnosis Date Noted   Perirectal abscess 04/25/2023   Peri-rectal abscess 04/25/2023   Hemorrhoids 07/11/2019   Light headedness 06/27/2019   UTI symptoms 06/27/2019   Age-related cataract 10/02/2018   Abnormal CXR 06/25/2018   Cough 06/20/2018   Nasal congestion 06/20/2018   Allergic rhinitis due to pollen 06/20/2018   Anxiety 05/18/2018   Dysfunction of both eustachian tubes 05/18/2018   Excessive cerumen in left ear canal  05/18/2018   Reactive airway disease 05/18/2018   Vitamin D  deficiency 05/18/2018   Tympanic membrane disorder, 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 Pharmacy:   [id number] [place] - [address] AT [facility] [address] [place] [address] Phone: [phone] Fax: [phone]     Social Drivers of Health (SDOH) Social History: SDOH Screenings   Food Insecurity: No Food Insecurity (04/25/2023)  Housing: Low Risk  (04/25/2023)  Transportation Needs: No Transportation Needs (04/25/2023)  Utilities: Not At Risk (04/25/2023)  Depression (PHQ2-9): Low Risk  (01/17/2020)  Social Connections: Moderately Isolated (04/25/2023)  Tobacco Use: Medium Risk (04/25/2023)   SDOH Interventions:     Readmission Risk Interventions     No data to display

## 2023-04-26 NOTE — Anesthesia Postprocedure Evaluation (Signed)
 Anesthesia Post Note  Patient: Daniel Blake  Procedure(s) Performed: IRRIGATION AND DEBRIDEMENT PERIRECTAL ABSCESS ANAL RECTAL EXAM UNDER ANESTHESIA     Patient location during evaluation: PACU Anesthesia Type: General Level of consciousness: awake and alert Pain management: pain level controlled Vital Signs Assessment: post-procedure vital signs reviewed and stable Respiratory status: spontaneous breathing, nonlabored ventilation and respiratory function stable Cardiovascular status: blood pressure returned to baseline and stable Postop Assessment: no apparent nausea or vomiting Anesthetic complications: no   No notable events documented.            Tammala Weider

## 2023-04-26 NOTE — Progress Notes (Signed)
 PROGRESS NOTE    Daniel Blake  UJW:[id number] DOB: [birth year] DOA: 04/25/2023 PCP: [facility]   Chief Complaint  Patient presents with   Abdominal Pain   Rectal Pain   Urinary Retention    Brief Narrative:  Patient 78 year old gentleman history of hypertension, hyperlipidemia admitted with concerns for perirectal abscess.  Patient also noted to have issues with constipation and hemorrhoids.  Patient noted to have taking a laxative prior to admission with very large painful bowel movement.  Patient seen in the ED noted to have a leukocytosis white count of 13,000, abdominal CT notable for 2.8 x 2.8 x 2.3 cm rectal abscess.  Patient seen in consultation by general surgery and patient brought to the OR for I&D of abscess.  Patient placed empirically on IV Zosyn .   Assessment & Plan:   Principal Problem:   Perirectal abscess Active Problems:   Reflux esophagitis   Benign prostatic hyperplasia with weak urinary stream   Peri-rectal abscess   Constipation   Gastroesophageal reflux disease   #1 perirectal abscess -Patient presenting with concerns for perirectal abscess. -Patient status post I&D per general surgery in the OR 04/25/2023 with 10 cc of pus noted blood cultures not taken. -Patient being followed by general surgery, and had packing removed. -Will transition from IV Zosyn  to Augmentin  and treat empirically for 5 to 7 days. -Continue current wound care dressing changes. -Per general surgery.  2.  Hypertension -BP stable.  3.  GERD -PPI.  4.  Constipation -MiraLAX  17 g twice daily, Senokot-S twice daily.  5.  BPH with urinary retention/bladder outlet obstruction -Status post Foley catheter placement. -Continue Flomax . -Voiding trial. -If fails voiding trial Foley catheter will need to be placed back in with outpatient follow-up with urology.  6.  Hyperlipidemia -Continue home regimen Zetia , Lovaza     DVT prophylaxis: SCDs Code Status:  Full Family Communication: Updated patient.  No family at bedside. Disposition: Likely home when clinically improved hopefully in the next 24 hours.  Status is: Observation The patient remains OBS appropriate and will d/c before 2 midnights.   Consultants:  General Surgery: Dr. Camilo Cella 04/25/2023  Procedures:  CT abdomen and pelvis 04/25/2023 Incision and drainage of perirectal abscess-posterior midline/anorectal exam under anesthesia per general surgery: Dr. Camilo Cella 04/25/2023  Antimicrobials:  Anti-infectives (From admission, onward)    Start     Dose/Rate Route Frequency Ordered Stop   04/25/23 1600  piperacillin -tazobactam (ZOSYN ) IVPB 3.375 g        3.375 g 12.5 mL/hr over 240 Minutes Intravenous Every 8 hours 04/25/23 0947     04/25/23 1000  piperacillin -tazobactam (ZOSYN ) IVPB 3.375 g        3.375 g 100 mL/hr over 30 Minutes Intravenous  Once 04/25/23 0947 04/25/23 1138         Subjective: Patient laying in bed.  States rectal pain much improved after I&D.  Denies any chest pain or shortness of breath.  No abdominal pain.  States Foley catheter was placed and on admission and would like a voiding trial to see if he passes. States packing was just removed from rectal abscess.  Objective: Vitals:   04/25/23 1913 04/26/23 0525 04/26/23 0736 04/26/23 1218  BP: 122/68 101/68 (!) 99/59 105/62  Pulse: 79 70 65 68  Resp: 18 18 18    Temp: 98.7 F (37.1 C) 97.9 F (36.6 C) 98.4 F (36.9 C)   TempSrc:  Oral Oral   SpO2: 93% 93% 93% 93%  Weight:  Height:        Intake/Output Summary (Last 24 hours) at 04/26/2023 1650 Last data filed at 04/26/2023 0542 Gross per 24 hour  Intake --  Output 600 ml  Net -600 ml   Filed Weights   04/25/23 0700  Weight: 90.7 kg    Examination:  General exam: Appears calm and comfortable  Respiratory system: Clear to auscultation anterior lung fields.  No wheezes, no crackles, no rhonchi.  Fair air movement.  Speaking in full  sentences.Aaron Aas Respiratory effort normal. Cardiovascular system: S1 & S2 heard, RRR. No JVD, murmurs, rubs, gallops or clicks. No pedal edema. Gastrointestinal system: Abdomen is nondistended, soft and nontender. No organomegaly or masses felt. Normal bowel sounds heard. Central nervous system: Alert and oriented. No focal neurological deficits. Extremities: Symmetric 5 x 5 power. Skin: No rashes, lesions or ulcers Psychiatry: Judgement and insight appear normal. Mood & affect appropriate.     Data Reviewed: I have personally reviewed following labs and imaging studies  CBC: Recent Labs  Lab 04/25/23 0726 04/26/23 0551  WBC 13.7* 11.0*  NEUTROABS 10.5*  --   HGB 14.4 13.1  HCT 42.9 39.1  MCV 91.9 92.7  PLT 303 309    Basic Metabolic Panel: Recent Labs  Lab 04/25/23 0726 04/26/23 0551  NA 138 137  K 3.9 4.0  CL 105 102  CO2 25 26  GLUCOSE 107* 112*  BUN 13 11  CREATININE 1.03 1.07  CALCIUM 8.9 8.2*    GFR: Estimated Creatinine Clearance: 64.3 mL/min (by C-G formula based on SCr of 1.07 mg/dL).  Liver Function Tests: Recent Labs  Lab 04/25/23 0726 04/26/23 0551  AST 16 17  ALT 16 13  ALKPHOS 45 42  BILITOT 0.7 1.0  PROT 7.3 6.3*  ALBUMIN 4.0 3.2*    CBG: No results for input(s): "GLUCAP" in the last 168 hours.   Recent Results (from the past 240 hours)  Urine Culture     Status: None   Collection Time: 04/25/23  6:27 AM   Specimen: Urine, Clean Catch  Result Value Ref Range Status   Specimen Description   Final    URINE, CLEAN CATCH Performed at [facility], [address] [place], [place] [address]    Special Requests   Final    Normal Performed at [facility], [address] [place], [place] [address]    Culture   Final    NO GROWTH Performed at [facility], [address], [place], [place] [address]    Report Status 04/26/2023 FINAL  Final         Radiology Studies: CT ABDOMEN PELVIS W CONTRAST Result  Date: 04/25/2023 CLINICAL DATA:  Abdominal pain, acute, nonlocalized EXAM: CT ABDOMEN AND PELVIS WITH CONTRAST TECHNIQUE: Multidetector CT imaging of the abdomen and pelvis was performed using the standard protocol following bolus administration of intravenous contrast. RADIATION DOSE REDUCTION: This exam was performed according to the departmental dose-optimization program which includes automated exposure control, adjustment of the mA and/or kV according to patient size and/or use of iterative reconstruction technique. CONTRAST:  OMNIPAQUE  IOHEXOL  300 MG/ML  SOLN COMPARISON:  Abdominal ultrasound dated March 04, 2023. CT chest dated January 17, 2022. FINDINGS: Lower chest: No acute abnormality. Similar 4 mm pulmonary nodules noted at the left lung base (series 302, image 5). Hepatobiliary: Diffusely decreased attenuation of the hepatic parenchyma, compatible with hepatic steatosis. Subcentimeter focal hypodensity at the right hepatic dome is too small  to definitively characterize. No gallstones, gallbladder wall thickening, or biliary dilatation. Pancreas: Unremarkable. No pancreatic ductal dilatation or surrounding inflammatory changes. Spleen: Spleen is mildly enlarged measuring proximally 12.4 cm in craniocaudal dimension. No focal abnormality. Adrenals/Urinary Tract: Adrenal glands are unremarkable. Kidneys enhance symmetrically. Stable right renal cysts. A subcentimeter focal hypodensity in the interpolar left kidney is too small to definitively characterize. No suspicious focal lesion. No renal calculi or hydronephrosis. Bladder is unremarkable. Stomach/Bowel: Stomach is within normal limits. Appendix appears within normal limits. The small bowel is grossly unremarkable. No evidence of obstruction. Scattered sigmoid colonic diverticulosis without evidence of acute diverticulitis. There is a hypoattenuating peripherally enhancing collection abutting and adjacent to the posterior margin of the rectum,  measuring approximately 2.8 x 2.8 x 2.3 cm (AP by TR by cc) (series 301, image 88 and series 602, image 84). There is surrounding fat stranding. Vascular/Lymphatic: The abdominal aorta is normal in caliber. Atherosclerotic calcification of the abdominal aorta and its major branch vessels. No enlarged abdominal or pelvic lymph nodes. Reproductive: Prostate is unremarkable. Other: No abdominopelvic ascites. No intraperitoneal free air. No abdominal wall hernia. Musculoskeletal: No acute osseous abnormality. No suspicious osseous lesion. Multilevel degenerative disc changes of the lumbar spine, most pronounced at L4-L5 minimal grade 1 retrolisthesis of L4 on L5. IMPRESSION: 1. A peripherally enhancing collection, of indeterminate sterility, abutting and adjacent to the posterior margin of the rectum with surrounding fat stranding is concerning for a perirectal abscess. 2. Scattered sigmoid colonic diverticulosis without evidence of acute diverticulitis. 3. Hepatic steatosis. 4. Mild splenomegaly. 5.  Aortic Atherosclerosis (ICD10-I70.0). Electronically Signed   By: Mannie Seek M.D.   On: 04/25/2023 09:25        Scheduled Meds:  Chlorhexidine  Gluconate Cloth  6 each Topical Daily   cholecalciferol   1,000 Units Oral Daily   ezetimibe   10 mg Oral Daily   gatifloxacin   1 drop Left Eye QID   omega-3 acid ethyl esters  1 g Oral Daily   pantoprazole   40 mg Oral Daily   polyethylene glycol  17 g Oral BID   prednisoLONE  acetate  1 drop Left Eye QID   senna-docusate  1 tablet Oral BID   tamsulosin   0.4 mg Oral Daily   vitamin B-12  100 mcg Oral Daily   Continuous Infusions:  piperacillin -tazobactam (ZOSYN )  IV 3.375 g (04/26/23 1621)     LOS: 0 days    Time spent: 40 minutes    Hilda Lovings, MD [facility]   To contact the attending provider between 7A-7P or the covering provider during after hours 7P-7A, please log into the web site [email or web address] and access using universal [facility] password for that web site. If you do not have the password, please call the hospital operator.  04/26/2023, 4:50 PM

## 2023-04-26 NOTE — Care Management CC44 (Signed)
 Condition Code 44 Documentation Completed  Patient Details  Name: Daniel Blake MRN: [id number] Date of Birth: [birth year]   Condition Code 44 given:  Yes Patient signature on Condition Code 44 notice:  Yes Documentation of 2 MD's agreement:  Yes Code 44 added to claim:  Yes    Omie Bickers, RN 04/26/2023, 11:50 AM

## 2023-04-26 NOTE — Care Management Obs Status (Signed)
 MEDICARE OBSERVATION STATUS NOTIFICATION   Patient Details  Name: Daniel Blake MRN: [id number] Date of Birth: 12/11/1945   Medicare Observation Status Notification Given:  Yes    Omie Bickers, RN 04/26/2023, 11:50 AM

## 2023-04-26 NOTE — Progress Notes (Signed)
 1 Day Post-Op  Subjective: CC: Rectal pain greatly improved. Has not required narcotic pain medication overnight. Seen with RN for packing removal. Tolerating diet without n/v. Passing flatus.   Afebrile. No tachycardia. Last BP 105/62. WBC 11 from 13.7.   Objective: Vital signs in last 24 hours: Temp:  [97.9 F (36.6 C)-99.2 F (37.3 C)] 98.4 F (36.9 C) (01/15 0736) Pulse Rate:  [65-97] 65 (01/15 0736) Resp:  [12-23] 18 (01/15 0736) BP: (99-151)/(59-98) 99/59 (01/15 0736) SpO2:  [91 %-97 %] 93 % (01/15 0736)    Intake/Output from previous day: 01/14 0701 - 01/15 0700 In: 446.7 [I.V.:400; IV Piggyback:46.7] Out: 1455 [Urine:1450; Blood:5] Intake/Output this shift: No intake/output data recorded.  PE: Gen:  Alert, NAD, pleasant GU: Chaperone present, RN - Autumn Grant. Posterior midline incision present with packing in place. This was removed with bloody purulent drainage present on packing. No drainage from the open wound. No surrounding erythema or induration. No obvious fluctuance.    Lab Results:  Recent Labs    04/25/23 0726 04/26/23 0551  WBC 13.7* 11.0*  HGB 14.4 13.1  HCT 42.9 39.1  PLT 303 309   BMET Recent Labs    04/25/23 0726 04/26/23 0551  NA 138 137  K 3.9 4.0  CL 105 102  CO2 25 26  GLUCOSE 107* 112*  BUN 13 11  CREATININE 1.03 1.07  CALCIUM 8.9 8.2*   PT/INR No results for input(s): "LABPROT", "INR" in the last 72 hours. CMP     Component Value Date/Time   NA 137 04/26/2023 0551   NA 145 12/10/2014 0000   K 4.0 04/26/2023 0551   CL 102 04/26/2023 0551   CO2 26 04/26/2023 0551   GLUCOSE 112 (H) 04/26/2023 0551   BUN 11 04/26/2023 0551   BUN 18 12/10/2014 0000   CREATININE 1.07 04/26/2023 0551   CALCIUM 8.2 (L) 04/26/2023 0551   PROT 6.3 (L) 04/26/2023 0551   ALBUMIN 3.2 (L) 04/26/2023 0551   AST 17 04/26/2023 0551   ALT 13 04/26/2023 0551   ALKPHOS 42 04/26/2023 0551   BILITOT 1.0 04/26/2023 0551   GFRNONAA >60 04/26/2023  0551   Lipase  No results found for: "LIPASE"  Studies/Results: CT ABDOMEN PELVIS W CONTRAST Result Date: 04/25/2023 CLINICAL DATA:  Abdominal pain, acute, nonlocalized EXAM: CT ABDOMEN AND PELVIS WITH CONTRAST TECHNIQUE: Multidetector CT imaging of the abdomen and pelvis was performed using the standard protocol following bolus administration of intravenous contrast. RADIATION DOSE REDUCTION: This exam was performed according to the departmental dose-optimization program which includes automated exposure control, adjustment of the mA and/or kV according to patient size and/or use of iterative reconstruction technique. CONTRAST:  OMNIPAQUE  IOHEXOL  300 MG/ML  SOLN COMPARISON:  Abdominal ultrasound dated March 04, 2023. CT chest dated January 17, 2022. FINDINGS: Lower chest: No acute abnormality. Similar 4 mm pulmonary nodules noted at the left lung base (series 302, image 5). Hepatobiliary: Diffusely decreased attenuation of the hepatic parenchyma, compatible with hepatic steatosis. Subcentimeter focal hypodensity at the right hepatic dome is too small to definitively characterize. No gallstones, gallbladder wall thickening, or biliary dilatation. Pancreas: Unremarkable. No pancreatic ductal dilatation or surrounding inflammatory changes. Spleen: Spleen is mildly enlarged measuring proximally 12.4 cm in craniocaudal dimension. No focal abnormality. Adrenals/Urinary Tract: Adrenal glands are unremarkable. Kidneys enhance symmetrically. Stable right renal cysts. A subcentimeter focal hypodensity in the interpolar left kidney is too small to definitively characterize. No suspicious focal lesion. No renal calculi or hydronephrosis.  Bladder is unremarkable. Stomach/Bowel: Stomach is within normal limits. Appendix appears within normal limits. The small bowel is grossly unremarkable. No evidence of obstruction. Scattered sigmoid colonic diverticulosis without evidence of acute diverticulitis. There is a  hypoattenuating peripherally enhancing collection abutting and adjacent to the posterior margin of the rectum, measuring approximately 2.8 x 2.8 x 2.3 cm (AP by TR by cc) (series 301, image 88 and series 602, image 84). There is surrounding fat stranding. Vascular/Lymphatic: The abdominal aorta is normal in caliber. Atherosclerotic calcification of the abdominal aorta and its major branch vessels. No enlarged abdominal or pelvic lymph nodes. Reproductive: Prostate is unremarkable. Other: No abdominopelvic ascites. No intraperitoneal free air. No abdominal wall hernia. Musculoskeletal: No acute osseous abnormality. No suspicious osseous lesion. Multilevel degenerative disc changes of the lumbar spine, most pronounced at L4-L5 minimal grade 1 retrolisthesis of L4 on L5. IMPRESSION: 1. A peripherally enhancing collection, of indeterminate sterility, abutting and adjacent to the posterior margin of the rectum with surrounding fat stranding is concerning for a perirectal abscess. 2. Scattered sigmoid colonic diverticulosis without evidence of acute diverticulitis. 3. Hepatic steatosis. 4. Mild splenomegaly. 5.  Aortic Atherosclerosis (ICD10-I70.0). Electronically Signed   By: Mannie Seek M.D.   On: 04/25/2023 09:25    Anti-infectives: Anti-infectives (From admission, onward)    Start     Dose/Rate Route Frequency Ordered Stop   04/25/23 1600  piperacillin -tazobactam (ZOSYN ) IVPB 3.375 g        3.375 g 12.5 mL/hr over 240 Minutes Intravenous Every 8 hours 04/25/23 0947     04/25/23 1000  piperacillin -tazobactam (ZOSYN ) IVPB 3.375 g        3.375 g 100 mL/hr over 30 Minutes Intravenous  Once 04/25/23 0947 04/25/23 1138        Assessment/Plan POD 1 s/p EUA, I&D of perirectal abscess by Dr. Camilo Cella on 04/25/23 - Packing out. Do not need to replace - No further abx needed from our standpoint - Sitz baths TID and after BM's - Will arrange f/u.  - He requested no narcotic pain meds be sent to the  pharmacy  - Discussed discharge instructions, restrictions and return/call back precautions  - Okay for d/c from our standpoint.   FEN - Reg VTE - SCDs, okay for chem ppx from a general surgery standpoint ID - On zosyn . No further abx needed from our standpoint Foley - Okay to d/c from our standpoint. Was on flomax  prior to arrival due to recent bladder issues that he is followed by urology for.     LOS: 0 days    Delton Filbert , [facility] 04/26/2023, 12:13 PM Please see Amion for pager number during day hours 7:00am-4:30pm

## 2023-04-27 ENCOUNTER — Other Ambulatory Visit (HOSPITAL_COMMUNITY): Payer: Self-pay

## 2023-04-27 DIAGNOSIS — K611 Rectal abscess: Secondary | ICD-10-CM | POA: Diagnosis not present

## 2023-04-27 DIAGNOSIS — I10 Essential (primary) hypertension: Secondary | ICD-10-CM

## 2023-04-27 DIAGNOSIS — N401 Enlarged prostate with lower urinary tract symptoms: Secondary | ICD-10-CM | POA: Diagnosis not present

## 2023-04-27 DIAGNOSIS — E785 Hyperlipidemia, unspecified: Secondary | ICD-10-CM

## 2023-04-27 DIAGNOSIS — K5900 Constipation, unspecified: Secondary | ICD-10-CM | POA: Diagnosis not present

## 2023-04-27 DIAGNOSIS — K219 Gastro-esophageal reflux disease without esophagitis: Secondary | ICD-10-CM | POA: Diagnosis not present

## 2023-04-27 LAB — CBC WITH DIFFERENTIAL/PLATELET
Abs Immature Granulocytes: 0.02 10*3/uL (ref 0.00–0.07)
Basophils Absolute: 0 10*3/uL (ref 0.0–0.1)
Basophils Relative: 0 %
Eosinophils Absolute: 0.2 10*3/uL (ref 0.0–0.5)
Eosinophils Relative: 3 %
HCT: 40.1 % (ref 39.0–52.0)
Hemoglobin: 13.8 g/dL (ref 13.0–17.0)
Immature Granulocytes: 0 %
Lymphocytes Relative: 28 %
Lymphs Abs: 1.8 10*3/uL (ref 0.7–4.0)
MCH: 31.3 pg (ref 26.0–34.0)
MCHC: 34.4 g/dL (ref 30.0–36.0)
MCV: 90.9 fL (ref 80.0–100.0)
Monocytes Absolute: 0.7 10*3/uL (ref 0.1–1.0)
Monocytes Relative: 10 %
Neutro Abs: 3.7 10*3/uL (ref 1.7–7.7)
Neutrophils Relative %: 59 %
Platelets: 312 10*3/uL (ref 150–400)
RBC: 4.41 MIL/uL (ref 4.22–5.81)
RDW: 11.4 % — ABNORMAL LOW (ref 11.5–15.5)
WBC: 6.4 10*3/uL (ref 4.0–10.5)
nRBC: 0 % (ref 0.0–0.2)

## 2023-04-27 LAB — BASIC METABOLIC PANEL
Anion gap: 11 (ref 5–15)
BUN: 12 mg/dL (ref 8–23)
CO2: 24 mmol/L (ref 22–32)
Calcium: 8.4 mg/dL — ABNORMAL LOW (ref 8.9–10.3)
Chloride: 103 mmol/L (ref 98–111)
Creatinine, Ser: 1 mg/dL (ref 0.61–1.24)
GFR, Estimated: >60 mL/min (ref >60)
Glucose, Bld: 100 mg/dL — ABNORMAL HIGH (ref 70–99)
Potassium: 3.9 mmol/L (ref 3.5–5.1)
Sodium: 138 mmol/L (ref 135–145)

## 2023-04-27 MED ORDER — SENNOSIDES-DOCUSATE SODIUM 8.6-50 MG PO TABS
1.0000 | ORAL_TABLET | Freq: Two times a day (BID) | ORAL | 0 refills | Status: DC
  Filled 2023-04-27: qty 60, 30d supply, fill #0

## 2023-04-27 MED ORDER — POLYETHYLENE GLYCOL 3350 17 GM/SCOOP PO POWD
17.0000 g | Freq: Two times a day (BID) | ORAL | 0 refills | Status: DC
  Filled 2023-04-27: qty 238, 7d supply, fill #0

## 2023-04-27 MED ORDER — ACETAMINOPHEN 500 MG PO TABS
1000.0000 mg | ORAL_TABLET | Freq: Four times a day (QID) | ORAL | Status: AC | PRN
Start: 2023-04-27 — End: 2024-04-26

## 2023-04-27 NOTE — Discharge Summary (Signed)
Physician Discharge Summary  Daniel Blake PPI:[id number] DOB: October 11, 1945 DOA: 04/25/2023  PCP: [facility] [place], [place].  Admit date: 04/25/2023 Discharge date: 04/27/2023  Time spent: 55 minutes  Recommendations for Outpatient Follow-up:  Follow-up with [facility], [place]. in 2 weeks.  On follow-up patient needs a basic metabolic profile done to follow-up on electrolytes and renal function.  Patient's BPH/urinary retention will need to be followed up upon and if needed may need referral to follow-up with urology.  Constipation will need to be followed up upon. Follow-up with general surgery, Puja Maczis, PA on 05/09/2023.   Discharge Diagnoses:  Principal Problem:   Perirectal abscess Active Problems:   Reflux esophagitis   Benign prostatic hyperplasia with weak urinary stream   Peri-rectal abscess   Constipation   Gastroesophageal reflux disease   Hypertension   Hyperlipidemia   Discharge Condition: Stable and improved.  Diet recommendation: Regular  Filed Weights   04/25/23 0700  Weight: 90.7 kg    History of present illness:  HPI per Dr.Chiu Daniel Blake is a 78 y.o. male with medical history significant of HTN, HLD admitted for concerns of perirectal abscess. Pt states he has been having issues with constipation and hemorrhoids. Four days PTA, took a laxative which resulted in a very large and painful bowel movement. Pt thought he had "torn something." Pain was exquisite and he resorted to staying in a bathtub of warm water to ease the pain. Gradually, pt became weaker which prompted ED visit. Pt also reported concerns of being unable to empty the bladder.    In ED, pt was found to have WBC 13k. Foley was placed. Abd CT was notable for 2.8x2.8x2.3cm rectal abscess. EDP discussed with General Surgery who initially recommended medical admit for antibiotics. On arrival, pt was brought to OR for I/D of abscess.   Hospital Course:  #1 perirectal abscess -Patient  presented with concerns for perirectal abscess. -Patient status post I&D per general surgery in the OR 04/25/2023 with 10 cc of pus noted blood cultures not taken. -Patient being followed by general surgery, and had packing removed. -Patient was placed on IV Zosyn on admission subsequently transition to Augmentin.  -Patient remained afebrile. -Patient followed by general surgery who felt no further antibiotics needed on discharge. -Patient be discharged in stable and improved condition with outpatient follow-up with general surgery.   2.  Hypertension -BP remained stable.   3.  GERD -Patient maintained on PPI during the hospitalization.   4.  Constipation -Patient maintained on MiraLAX 17 g twice daily, Senokot-S twice daily. -Will discharge on bowel regimen of MiraLAX twice daily, Senokot-S twice daily.   5.  BPH with urinary retention/bladder outlet obstruction -Status post Foley catheter placement. -Patient maintained on Flomax.   -Patient underwent a voiding trial which was successful and patient be discharged home in stable condition on home regimen Flomax with outpatient follow-up with PCP.   -May benefit from outpatient follow-up with urology however will defer to PCP.     6.  Hyperlipidemia -Patient maintained on home regimen Zetia, Lovaza  Procedures: CT abdomen and pelvis 04/25/2023 Incision and drainage of perirectal abscess-posterior midline/anorectal exam under anesthesia per general surgery: Dr. Cliffton Asters 04/25/2023  Consultations: General Surgery: Dr. Cliffton Asters 04/25/2023   Discharge Exam: Vitals:   04/27/23 0432 04/27/23 0725  BP: 112/63 109/61  Pulse: (!) 55 60  Resp: 18 18  Temp: (!) 97.5 F (36.4 C) 98 F (36.7 C)  SpO2: 97% 97%    General:  NAD Cardiovascular: RRR no murmurs rubs or gallops.  No JVD.  No lower extremity edema. Respiratory: Clear to auscultation bilaterally.  No wheezes, no crackles, no rhonchi.  Fair air movement.  Speaking in full  sentences.  Discharge Instructions   Discharge Instructions     Diet general   Complete by: As directed    Increase activity slowly   Complete by: As directed    Increase activity slowly   Complete by: As directed       Allergies as of 04/27/2023       Reactions   Bee Venom Anaphylaxis   Amlodipine Other (See Comments)   Tired , foggy feeling  Tired , foggy feeling    Pravastatin Other (See Comments)   Myalgia Myalgia   Rosuvastatin Other (See Comments)        Medication List     TAKE these medications    acetaminophen 500 MG tablet Commonly known as: TYLENOL Take 2 tablets (1,000 mg total) by mouth every 6 (six) hours as needed.   ALPRAZolam 0.25 MG tablet Commonly known as: XANAX TAKE 1 TABLET BY MOUTH TWICE DAILY AS NEEDED FOR SLEEP OR ANXIETY   cholecalciferol 1000 units tablet Commonly known as: VITAMIN D Take 1,000 Units by mouth daily.   EPINEPHrine 0.3 mg/0.3 mL Soaj injection Commonly known as: EPI-PEN Inject 0.3 mLs (0.3 mg total) into the muscle as needed for anaphylaxis. INJECT 0.3ML INTO THE MUSCLE ONCE FOR ONE DOSE AS DIRECTED   ezetimibe 10 MG tablet Commonly known as: ZETIA TAKE 1 TABLET BY MOUTH DAILY   moxifloxacin 0.5 % ophthalmic solution Commonly known as: VIGAMOX Place 1 drop into the left eye in the morning, at noon, in the evening, and at bedtime.   omega-3 acid ethyl esters 1 g capsule Commonly known as: LOVAZA Take 1 g by mouth daily.   polyethylene glycol powder 17 GM/SCOOP powder Commonly known as: GLYCOLAX/MIRALAX Take 1 capful (17 g) with water by mouth 2 (two) times daily.   prednisoLONE acetate 1 % ophthalmic suspension Commonly known as: PRED FORTE Place 1 drop into the left eye 4 (four) times daily.   senna-docusate 8.6-50 MG tablet Commonly known as: Senokot-S Take 1 tablet by mouth 2 (two) times daily.   tamsulosin 0.4 MG Caps capsule Commonly known as: FLOMAX TAKE 1 CAPSULE(0.4 MG) BY MOUTH DAILY    vitamin B-12 100 MCG tablet Commonly known as: CYANOCOBALAMIN Take 100 mcg by mouth daily.   VITAMIN C PO Take 1 tablet by mouth daily.       Allergies  Allergen Reactions   Bee Venom Anaphylaxis   Amlodipine Other (See Comments)    Tired , foggy feeling  Tired , foggy feeling     Pravastatin Other (See Comments)    Myalgia Myalgia    Rosuvastatin Other (See Comments)    Follow-up Information     Maczis, Hedda Slade, PA-C Follow up on 05/09/2023.   Specialty: General Surgery Why: 1/28 at 11:30. Please bring a copy of your photo ID, insurance card and arrive 30 minutes prior to your appointment Contact information: [address] [place] [address] [phone]         [facility] [place], [place].. Schedule an appointment as soon as possible for a visit in 2 week(s).   Contact information: [address] [facility] [place] [address] [phone]                  The results of  significant diagnostics from this hospitalization (including imaging, microbiology, ancillary and laboratory) are listed below for reference.    Significant Diagnostic Studies: CT ABDOMEN PELVIS W CONTRAST Result Date: 04/25/2023 CLINICAL DATA:  Abdominal pain, acute, nonlocalized EXAM: CT ABDOMEN AND PELVIS WITH CONTRAST TECHNIQUE: Multidetector CT imaging of the abdomen and pelvis was performed using the standard protocol following bolus administration of intravenous contrast. RADIATION DOSE REDUCTION: This exam was performed according to the departmental dose-optimization program which includes automated exposure control, adjustment of the mA and/or kV according to patient size and/or use of iterative reconstruction technique. CONTRAST:  OMNIPAQUE IOHEXOL 300 MG/ML  SOLN COMPARISON:  Abdominal ultrasound dated March 04, 2023. CT chest dated January 17, 2022. FINDINGS: Lower chest: No acute abnormality. Similar 4 mm pulmonary nodules noted at the left lung base  (series 302, image 5). Hepatobiliary: Diffusely decreased attenuation of the hepatic parenchyma, compatible with hepatic steatosis. Subcentimeter focal hypodensity at the right hepatic dome is too small to definitively characterize. No gallstones, gallbladder wall thickening, or biliary dilatation. Pancreas: Unremarkable. No pancreatic ductal dilatation or surrounding inflammatory changes. Spleen: Spleen is mildly enlarged measuring proximally 12.4 cm in craniocaudal dimension. No focal abnormality. Adrenals/Urinary Tract: Adrenal glands are unremarkable. Kidneys enhance symmetrically. Stable right renal cysts. A subcentimeter focal hypodensity in the interpolar left kidney is too small to definitively characterize. No suspicious focal lesion. No renal calculi or hydronephrosis. Bladder is unremarkable. Stomach/Bowel: Stomach is within normal limits. Appendix appears within normal limits. The small bowel is grossly unremarkable. No evidence of obstruction. Scattered sigmoid colonic diverticulosis without evidence of acute diverticulitis. There is a hypoattenuating peripherally enhancing collection abutting and adjacent to the posterior margin of the rectum, measuring approximately 2.8 x 2.8 x 2.3 cm (AP by TR by cc) (series 301, image 88 and series 602, image 84). There is surrounding fat stranding. Vascular/Lymphatic: The abdominal aorta is normal in caliber. Atherosclerotic calcification of the abdominal aorta and its major branch vessels. No enlarged abdominal or pelvic lymph nodes. Reproductive: Prostate is unremarkable. Other: No abdominopelvic ascites. No intraperitoneal free air. No abdominal wall hernia. Musculoskeletal: No acute osseous abnormality. No suspicious osseous lesion. Multilevel degenerative disc changes of the lumbar spine, most pronounced at L4-L5 minimal grade 1 retrolisthesis of L4 on L5. IMPRESSION: 1. A peripherally enhancing collection, of indeterminate sterility, abutting and adjacent to  the posterior margin of the rectum with surrounding fat stranding is concerning for a perirectal abscess. 2. Scattered sigmoid colonic diverticulosis without evidence of acute diverticulitis. 3. Hepatic steatosis. 4. Mild splenomegaly. 5.  Aortic Atherosclerosis (ICD10-I70.0). Electronically Signed   By: Hart Robinsons M.D.   On: 04/25/2023 09:25    Microbiology: Recent Results (from the past 240 hours)  Urine Culture     Status: None   Collection Time: 04/25/23  6:27 AM   Specimen: Urine, Clean Catch  Result Value Ref Range Status   Specimen Description   Final    URINE, CLEAN CATCH Performed at [facility], [address] [place], [place] [address]    Special Requests   Final    Normal Performed at [facility], [address] [place], [place] [address]    Culture   Final    NO GROWTH Performed at [facility], [address] [place], [place] [address]    Report Status 04/26/2023 FINAL  Final     Labs: Basic Metabolic Panel: Recent Labs  Lab 04/25/23 0726 04/26/23 0551 04/27/23 0424  NA  138 137 138  K 3.9 4.0 3.9  CL 105 102 103  CO2 25 26 24   GLUCOSE 107* 112* 100*  BUN 13 11 12   CREATININE 1.03 1.07 1.00  CALCIUM 8.9 8.2* 8.4*   Liver Function Tests: Recent Labs  Lab 04/25/23 0726 04/26/23 0551  AST 16 17  ALT 16 13  ALKPHOS 45 42  BILITOT 0.7 1.0  PROT 7.3 6.3*  ALBUMIN 4.0 3.2*   No results for input(s): "LIPASE", "AMYLASE" in the last 168 hours. No results for input(s): "AMMONIA" in the last 168 hours. CBC: Recent Labs  Lab 04/25/23 0726 04/26/23 0551 04/27/23 0424  WBC 13.7* 11.0* 6.4  NEUTROABS 10.5*  --  3.7  HGB 14.4 13.1 13.8  HCT 42.9 39.1 40.1  MCV 91.9 92.7 90.9  PLT 303 309 312   Cardiac Enzymes: No results for input(s): "CKTOTAL", "CKMB", "CKMBINDEX", "TROPONINI" in the last 168 hours. BNP: BNP (last 3 results) No results for input(s): "BNP" in the last 8760 hours.  ProBNP (last 3 results) No  results for input(s): "PROBNP" in the last 8760 hours.  CBG: No results for input(s): "GLUCAP" in the last 168 hours.     Signed:  Ramiro Harvest MD.  [facility] 04/27/2023, 12:59 PM

## 2023-04-27 NOTE — Plan of Care (Signed)
A/ox4 and on room air. No complaints of pain this shift. Up with self care. Passed trial of void.    Problem: Education: Goal: Knowledge of General Education information will improve Description: Including pain rating scale, medication(s)/side effects and non-pharmacologic comfort measures Outcome: Progressing   Problem: Health Behavior/Discharge Planning: Goal: Ability to manage health-related needs will improve Outcome: Progressing   Problem: Clinical Measurements: Goal: Ability to maintain clinical measurements within normal limits will improve Outcome: Progressing Goal: Will remain free from infection Outcome: Progressing Goal: Diagnostic test results will improve Outcome: Progressing Goal: Respiratory complications will improve Outcome: Progressing Goal: Cardiovascular complication will be avoided Outcome: Progressing   Problem: Activity: Goal: Risk for activity intolerance will decrease Outcome: Progressing   Problem: Nutrition: Goal: Adequate nutrition will be maintained Outcome: Progressing   Problem: Coping: Goal: Level of anxiety will decrease Outcome: Progressing   Problem: Elimination: Goal: Will not experience complications related to bowel motility Outcome: Progressing Goal: Will not experience complications related to urinary retention Outcome: Progressing   Problem: Pain Management: Goal: General experience of comfort will improve Outcome: Progressing   Problem: Safety: Goal: Ability to remain free from injury will improve Outcome: Progressing   Problem: Skin Integrity: Goal: Risk for impaired skin integrity will decrease Outcome: Progressing

## 2023-04-27 NOTE — Plan of Care (Signed)

## 2023-04-27 NOTE — Progress Notes (Addendum)
Patient discharged.  Patient did not have a PIV.  Reviewed discharge instructions, medications and follow up appts with patient and wife.  Gave patient copy of discharge instructions.  Prescriptions were filled in the [facility] pharmacy but were all OTC.  Patient and wife refused TOC meds stating them have them at home.  No additional questions or concerns at this time.

## 2023-10-10 ENCOUNTER — Ambulatory Visit: Admitting: Family Medicine

## 2023-10-10 ENCOUNTER — Encounter: Payer: Self-pay | Admitting: Family Medicine

## 2023-10-10 VITALS — BP 120/82 | HR 71 | Temp 97.4°F | Ht 73.0 in | Wt 197.0 lb

## 2023-10-10 DIAGNOSIS — J4520 Mild intermittent asthma, uncomplicated: Secondary | ICD-10-CM

## 2023-10-10 DIAGNOSIS — N3281 Overactive bladder: Secondary | ICD-10-CM | POA: Diagnosis not present

## 2023-10-10 DIAGNOSIS — R3914 Feeling of incomplete bladder emptying: Secondary | ICD-10-CM

## 2023-10-10 DIAGNOSIS — N401 Enlarged prostate with lower urinary tract symptoms: Secondary | ICD-10-CM

## 2023-10-10 DIAGNOSIS — J300 Vasomotor rhinitis: Secondary | ICD-10-CM | POA: Diagnosis not present

## 2023-10-10 DIAGNOSIS — I10 Essential (primary) hypertension: Secondary | ICD-10-CM

## 2023-10-10 DIAGNOSIS — E291 Testicular hypofunction: Secondary | ICD-10-CM

## 2023-10-10 DIAGNOSIS — F419 Anxiety disorder, unspecified: Secondary | ICD-10-CM

## 2023-10-10 DIAGNOSIS — J301 Allergic rhinitis due to pollen: Secondary | ICD-10-CM

## 2023-10-10 DIAGNOSIS — I700 Atherosclerosis of aorta: Secondary | ICD-10-CM

## 2023-10-10 NOTE — Patient Instructions (Addendum)
  VISIT SUMMARY: Today, you came in to establish care after your previous provider's office closed. We reviewed your recent physical and lab work, which were all normal. We discussed your history of hypogonadism, anxiety, hyperlipidemia, presumed BPH, fluctuating blood pressure, allergies, mild asthma, and recent cataract surgery. You are currently taking several medications and supplements to manage these conditions.  YOUR PLAN: -BENIGN PROSTATIC HYPERPLASIA (BPH): BPH is an enlarged prostate gland that can cause urinary problems. You are currently taking tamsulosin  but have not noticed improvement. You have an upcoming urology appointment to further evaluate your symptoms and may consider discontinuing tamsulosin .  -HYPOGONADISM: Hypogonadism is a condition where the body doesn't produce enough testosterone . Your testosterone  levels are currently normal without supplementation, and you are doing well without therapy.  -ANXIETY: Anxiety is a feeling of worry or fear. You are managing it with alprazolam  0.25 mg as needed, using it infrequently. We discussed the risks of alprazolam , but your low dose and infrequent use are noted.  -HYPERLIPIDEMIA: Hyperlipidemia is having high levels of fats in the blood. You are managing it with ezetimibe  (Zetia ).  -ALLERGIC RHINITIS: Allergic rhinitis is an allergic reaction that causes sneezing, congestion, and runny nose. You are managing it with Allegra and Flonase, especially during early fall and with excessive exercise.  -ASTHMA: Asthma is a condition where your airways narrow and swell, causing breathing difficulties. You have mild asthma associated with allergies and manage it with albuterol  as needed.  -GASTROESOPHAGEAL REFLUX DISEASE (GERD): GERD is a digestive disorder where stomach acid irritates the food pipe lining. You are managing it with omeprazole 10 mg as needed.  -GENERAL HEALTH MAINTENANCE: Your recent physical examination and lab work were  normal. You are taking supplements including B12, CoQ10, vitamin C, vitamin D , and AREDS2 for eye health.  INSTRUCTIONS: Please schedule a follow-up appointment in six months. You also have an upcoming urology appointment and a follow-up with Dr. Jama next month for your previous rectal abscess.                      Contains text generated by [employer].                                 Contains text generated by [employer]bridge.

## 2023-10-10 NOTE — Progress Notes (Signed)
 Assessment & Plan   Assessment/Plan:     Assessment & Plan Benign Prostatic Hyperplasia (BPH) Previously diagnosed with BPH, currently on tamsulosin . He discontinued dutasteride and questions the BPH diagnosis, suspecting overactive bladder. He has an upcoming urology appointment and is considering discontinuing tamsulosin  due to lack of symptom improvement. Reports difficulty emptying bladder completely post-exercise.  Hypogonadism Testosterone  levels are within normal limits without supplementation. Previously took testosterone  but discontinued due to lack of perceived benefit. Levels are age-appropriate and he is doing well without therapy.  Anxiety Managed with alprazolam  0.25 mg as needed, used infrequently (30 pills over six months) for situational anxiety and occasionally for sleep. Discussed risks of alprazolam , including dementia and falls, but acknowledged low dose and infrequent use.  Hyperlipidemia Managed with ezetimibe  (Zetia ).  Allergic Rhinitis Managed with Allegra and Flonase. Experiences seasonal exacerbations in early fall and congestion with excessive exercise.  Asthma Mild asthma associated with allergies, managed with albuterol  as needed.  Gastroesophageal Reflux Disease (GERD) Managed with omeprazole 10 mg as needed.  General Health Maintenance Recent physical examination and lab work in March 2025 were within normal limits, including a complete metabolic panel, CBC, testosterone , and TSH. Takes supplements: B12, CoQ10, vitamin C, vitamin D , and AREDS2 for eye health.  Follow-up Establishing care with new provider after previous practice closure. Upcoming urology appointment post-July and follow-up with Dr. Jama next month for previous rectal abscess. - Schedule follow-up appointment in six months.      There are no discontinued medications.  No follow-ups on file.        Subjective:   Encounter date: 10/10/2023  Babacar Haycraft is a 78 y.o. male  who has Epigastric pain; Reflux esophagitis; Elevated cholesterol; Benign prostatic hyperplasia with weak urinary stream; Complex renal cyst; Atherosclerosis of aorta (HCC); Acute otitis externa of right ear; Tympanic membrane disorder, right; Anxiety; Dysfunction of both eustachian tubes; Excessive cerumen in left ear canal; Reactive airway disease; Vitamin D  deficiency; Cough; Nasal congestion; Allergic rhinitis due to pollen; Abnormal CXR; Age-related cataract; Light headedness; UTI symptoms; Hemorrhoids; Perirectal abscess; Peri-rectal abscess; Constipation; Gastroesophageal reflux disease; Hypertension; and Hyperlipidemia on their problem list..   He  has a past medical history of Allergy, Chicken pox, GERD (gastroesophageal reflux disease), Hyperlipidemia, Hypertension, and PUD (peptic ulcer disease).SABRA   He presents with chief complaint of New Patient (Initial Visit) .   Discussed the use of AI scribe software for clinical note transcription with the patient, who gave verbal consent to proceed.  History of Present Illness Yoan Sallade is a 78 year old male who presents for establishing care after his previous provider's office closed.  He had a recent physical on July 10, 2023, with lab work including a complete metabolic panel, CBC, testosterone  levels, and TSH, all within normal limits. He has a history of hypogonadism and previously took testosterone , which he has since discontinued as he felt it was no longer beneficial. His last testosterone  levels were within the normal range.  He experiences anxiety and takes alprazolam  0.25 mg as needed, using approximately 30 pills over six months, primarily for occasional sleep issues or to improve his golf performance.  He has hyperlipidemia and takes ezetimibe  (Zetia ) for management.  He was previously prescribed tamsulosin  0.4 mg daily for presumed BPH but believes he has an overactive bladder rather than an enlarged prostate. He has discontinued  dutasteride. He reports difficulty emptying his bladder completely, especially after exercise, but has not noticed a change in symptoms with tamsulosin .  He has  a history of fluctuating blood pressure, which he feels is currently under control. He no longer takes lisinopril.  He has a history of allergies and mild asthma, for which he uses albuterol  as needed. He also takes Allegra daily and Flonase for congestion, which worsens in early fall and with excessive exercise.  He underwent cataract surgery recently and takes AREDS2 vitamins for eye health. He also takes B12, CoQ10, vitamin C, and vitamin D  as supplements.  He had a rectal abscess procedure in January and has a follow-up with Dr. Jama next month. He reported discomfort three months ago, which has since improved.  No chest pain or shortness of breath. He is in good shape for his age, engaging in regular exercise such as treadmill, elliptical, squats, and leg lifts.     ROS  Past Surgical History:  Procedure Laterality Date   INCISION AND DRAINAGE PERIRECTAL ABSCESS N/A 04/25/2023   Procedure: IRRIGATION AND DEBRIDEMENT PERIRECTAL ABSCESS;  Surgeon: Teresa Lonni HERO, MD;  Location: [facility] OR;  Service: General;  Laterality: N/A;   TONSILLECTOMY  1962    Outpatient Medications Prior to Visit  Medication Sig Dispense Refill   acetaminophen  (TYLENOL ) 500 MG tablet Take 2 tablets (1,000 mg total) by mouth every 6 (six) hours as needed.     ALPRAZolam  (XANAX ) 0.25 MG tablet TAKE 1 TABLET BY MOUTH TWICE DAILY AS NEEDED FOR SLEEP OR ANXIETY 60 tablet 1   Ascorbic Acid (VITAMIN C PO) Take 1 tablet by mouth daily.     cholecalciferol  (VITAMIN D ) 1000 units tablet Take 1,000 Units by mouth daily.     EPINEPHrine  0.3 mg/0.3 mL IJ SOAJ injection Inject 0.3 mLs (0.3 mg total) into the muscle as needed for anaphylaxis. INJECT 0.3ML INTO THE MUSCLE ONCE FOR ONE DOSE AS DIRECTED 1 each 4   ezetimibe  (ZETIA ) 10 MG tablet TAKE 1 TABLET BY MOUTH  DAILY 90 tablet 0   moxifloxacin (VIGAMOX) 0.5 % ophthalmic solution Place 1 drop into the left eye in the morning, at noon, in the evening, and at bedtime.     omega-3 acid ethyl esters (LOVAZA ) 1 g capsule Take 1 g by mouth daily.     polyethylene glycol powder (GLYCOLAX /MIRALAX ) 17 GM/SCOOP powder Take 1 capful (17 g) with water by mouth 2 (two) times daily. 238 g 0   prednisoLONE  acetate (PRED FORTE ) 1 % ophthalmic suspension Place 1 drop into the left eye 4 (four) times daily.     senna-docusate (SENOKOT-S) 8.6-50 MG tablet Take 1 tablet by mouth 2 (two) times daily. 60 tablet 0   tamsulosin  (FLOMAX ) 0.4 MG CAPS capsule TAKE 1 CAPSULE(0.4 MG) BY MOUTH DAILY 90 capsule 0   vitamin B-12 (CYANOCOBALAMIN ) 100 MCG tablet Take 100 mcg by mouth daily.     No facility-administered medications prior to visit.    Family History  Problem Relation Age of Onset   Heart disease Mother    Hyperlipidemia Mother    Hypertension Mother    Asthma Father    COPD Father    Hyperlipidemia Father    Hypertension Father    Stroke Father    Arthritis Sister    Early death Sister    Heart disease Brother    Heart disease Brother    Heart attack Brother    Early death Brother     Social History   Socioeconomic History   Marital status: Married    Spouse name: Not on file   Number of children: Not on file  Years of education: Not on file   Highest education level: Not on file  Occupational History   Not on file  Tobacco Use   Smoking status: Former    Current packs/day: 0.00    Types: Cigarettes    Quit date: 10/19/2017    Years since quitting: 5.9   Smokeless tobacco: Never  Substance and Sexual Activity   Alcohol use: No   Drug use: No   Sexual activity: Never  Other Topics Concern   Not on file  Social History Narrative   Not on file   Social Drivers of Health   Financial Resource Strain: Not on file  Food Insecurity: No Food Insecurity (04/25/2023)   Hunger Vital Sign     Worried About Running Out of Food in the Last Year: Never true    Ran Out of Food in the Last Year: Never true  Transportation Needs: No Transportation Needs (04/25/2023)   PRAPARE - Administrator, Civil Service (Medical): No    Lack of Transportation (Non-Medical): No  Physical Activity: Not on file  Stress: Not on file  Social Connections: Moderately Isolated (04/25/2023)   Social Connection and Isolation Panel    Frequency of Communication with Friends and Family: Three times a week    Frequency of Social Gatherings with Friends and Family: Three times a week    Attends Religious Services: Never    Active Member of Clubs or Organizations: No    Attends Banker Meetings: Never    Marital Status: Married  Catering manager Violence: Not At Risk (04/25/2023)   Humiliation, Afraid, Rape, and Kick questionnaire    Fear of Current or Ex-Partner: No    Emotionally Abused: No    Physically Abused: No    Sexually Abused: No                                                                                                  Objective:  Physical Exam: BP 120/82   Pulse 71   Temp (!) 97.4 F (36.3 C)   Ht 6' 1 (1.854 m)   Wt 197 lb (89.4 kg)   SpO2 97%   BMI 25.99 kg/m    Results LABS Complete Metabolic Panel: within normal limits (07/10/2023) CBC: within normal limits (07/10/2023) Testosterone  Total: within normal limits (07/10/2023) TSH: within normal limits (07/10/2023) Testosterone : 500 ng/dL   Physical Exam GENERAL: Alert, cooperative, well developed, no acute distress HEENT: Normocephalic, normal oropharynx, moist mucous membranes CHEST: Clear to auscultation bilaterally, no wheezes, rhonchi, or crackles CARDIOVASCULAR: Normal heart rate and rhythm, S1 and S2 normal without murmurs ABDOMEN: Soft, non-tender, non-distended, without organomegaly, normal bowel sounds EXTREMITIES: No cyanosis or edema NEUROLOGICAL: Cranial nerves grossly intact, moves  all extremities without gross motor or sensory deficit     No results found.  No results found for this or any previous visit (from the past 2160 hours).      Beverley Adine Hummer, MD, MS

## 2023-10-27 ENCOUNTER — Ambulatory Visit: Payer: Self-pay

## 2023-10-27 NOTE — Telephone Encounter (Signed)
 FYI Only or Action Required?: FYI only for provider.  Patient was last seen in primary care on 10/10/2023 by Sebastian Beverley NOVAK, MD.  Called Nurse Triage reporting Fatigue; Blurred Vision; fogginess/dizziness, off-balance intermittently; itchiness to scalp and face; SOB with exertion intermittently; Foot Pain; and Urinary Frequency.  Symptoms began several months ago. Worsening over past 30 days per pt  Interventions attempted: Rest, hydration, or home remedies and Other: exercise.  Symptoms are: gradually worsening.  Triage Disposition: See Physician Within 24 Hours  Patient/caregiver understands and will follow disposition?: No, refuses disposition      Copied from CRM [id number]. Topic: Clinical - Red Word Triage >> Oct 27, 2023  9:13 AM Suzen RAMAN wrote: Red Word that prompted transfer to Nurse Triage: Extreme fatigue,leg discomfort,weakness, change in vision not alert    Reason for Disposition  [1] MODERATE weakness (e.g., interferes with work, school, normal activities) AND [2] persists > 3 days  Answer Assessment - Initial Assessment Questions 1. DESCRIPTION: Describe how you are feeling.     Cataract surgery in December Blurriness in vision been going on for 2-3 months maybe longer than that Biggest symptom is the tiredness Have always worked out, now takes me an awful long time to recover, get worn out more easily in last 3-4 months Lot of itchiness around scalp, eyes, face, new symptom in last 6-7 months Pain in feet ache all the time Everything seems like going downhill in last 6 months or so No chest pain but do find having trouble catching my breath when walking up steps for last month or 2 Just feel foggy Pt is alert and oriented 4. CAUSE: What do you think is causing the weakness or fatigue? (e.g., not drinking enough fluids, medical problem, trouble sleeping)     Sleep well but have to get up to go to bathroom 3-4x/night think the possible diabetes, no  polydipsia or polyphagia, try to drink water most of the day Not talked to anybody about diabetes but way been feeling, feeling different over last 30 days, bombarded my mind with what could be making me feel this way, should feel good after work out but feel terrible after working out, felt like giving up harder to recover after exercise 6. OTHER SYMPTOMS: Do you have any other symptoms? (e.g., chest pain, fever, cough, SOB, vomiting, diarrhea, bleeding, other areas of pain)     Lot of congestion related more to allergies than anything  More tired in last 2 weeks than have been, all symptoms worsened in the last 30 days No SOB unless go outside and working bending over, breathing really strange, can get on elliptical and exercise machine and more I do that the better I breathe but times where SOB is unexplained comes on quickly, not 24/7, not sitting and relaxing, but so sleepy at those times, feel like could sleep 24 hours per day, don't like the way I'm feeling out No SOB today but just got out of breath, but tired Cross between foggy and dizzy Balance is less than what it was a while back, sometimes find myself falling off-balance within last 2-3 months with me being cognizant of my body Lot of this stuff seems to have come on very quickly Last year felt pretty good Yesterday felt really really bad, feel better today than did yesterday, yesterday felt real tired, sleeping 12 hours per day, going to bed 6 or 6:30 pm and waking up 6:30 am No pain with urination, states no swelling to prostate,  hx overactive bladder, bowel issues sees gastro, recent bowel surgery in December    Advised pt be examined today for symptoms, offered earliest appt available with PCP office, advised UC today, ED if feeling really out of it, pt refusing UC today. Scheduled pt for Monday am per pt preference, advised strongly pt get examined sooner if any worsening.  Protocols used: Weakness (Generalized) and  Fatigue-A-AH

## 2023-10-30 ENCOUNTER — Ambulatory Visit (INDEPENDENT_AMBULATORY_CARE_PROVIDER_SITE_OTHER): Admitting: Family Medicine

## 2023-10-30 ENCOUNTER — Encounter: Payer: Self-pay | Admitting: Family Medicine

## 2023-10-30 ENCOUNTER — Ambulatory Visit: Payer: Self-pay | Admitting: Family Medicine

## 2023-10-30 VITALS — BP 100/70 | HR 98 | Temp 98.3°F | Ht 73.0 in | Wt 197.6 lb

## 2023-10-30 DIAGNOSIS — Z131 Encounter for screening for diabetes mellitus: Secondary | ICD-10-CM | POA: Diagnosis not present

## 2023-10-30 DIAGNOSIS — E538 Deficiency of other specified B group vitamins: Secondary | ICD-10-CM | POA: Diagnosis not present

## 2023-10-30 DIAGNOSIS — R5383 Other fatigue: Secondary | ICD-10-CM

## 2023-10-30 DIAGNOSIS — F419 Anxiety disorder, unspecified: Secondary | ICD-10-CM | POA: Diagnosis not present

## 2023-10-30 LAB — COMPREHENSIVE METABOLIC PANEL WITH GFR
ALT: 15 U/L (ref 0–53)
AST: 17 U/L (ref 0–37)
Albumin: 4.5 g/dL (ref 3.5–5.2)
Alkaline Phosphatase: 54 U/L (ref 39–117)
BUN: 16 mg/dL (ref 6–23)
CO2: 29 meq/L (ref 19–32)
Calcium: 9.3 mg/dL (ref 8.4–10.5)
Chloride: 105 meq/L (ref 96–112)
Creatinine, Ser: 0.96 mg/dL (ref 0.40–1.50)
GFR: 75.22 mL/min (ref >60.00)
Glucose, Bld: 97 mg/dL (ref 70–99)
Potassium: 4.1 meq/L (ref 3.5–5.1)
Sodium: 142 meq/L (ref 135–145)
Total Bilirubin: 0.5 mg/dL (ref 0.2–1.2)
Total Protein: 6.7 g/dL (ref 6.0–8.3)

## 2023-10-30 LAB — CBC WITH DIFFERENTIAL/PLATELET
Basophils Absolute: 0 K/uL (ref 0.0–0.1)
Basophils Relative: 0.6 % (ref 0.0–3.0)
Eosinophils Absolute: 0.2 K/uL (ref 0.0–0.7)
Eosinophils Relative: 3.3 % (ref 0.0–5.0)
HCT: 44.8 % (ref 39.0–52.0)
Hemoglobin: 15.1 g/dL (ref 13.0–17.0)
Lymphocytes Relative: 26 % (ref 12.0–46.0)
Lymphs Abs: 1.6 K/uL (ref 0.7–4.0)
MCHC: 33.6 g/dL (ref 30.0–36.0)
MCV: 91.1 fl (ref 78.0–100.0)
Monocytes Absolute: 0.6 K/uL (ref 0.1–1.0)
Monocytes Relative: 10.6 % (ref 3.0–12.0)
Neutro Abs: 3.6 K/uL (ref 1.4–7.7)
Neutrophils Relative %: 59.5 % (ref 43.0–77.0)
Platelets: 294 K/uL (ref 150.0–400.0)
RBC: 4.91 Mil/uL (ref 4.22–5.81)
RDW: 13 % (ref 11.5–15.5)
WBC: 6 K/uL (ref 4.0–10.5)

## 2023-10-30 LAB — TSH: TSH: 2.19 u[IU]/mL (ref 0.35–5.50)

## 2023-10-30 LAB — VITAMIN B12: Vitamin B-12: 1090 pg/mL — ABNORMAL HIGH (ref 211–911)

## 2023-10-30 LAB — HEMOGLOBIN A1C: Hgb A1c MFr Bld: 6.2 % (ref 4.6–6.5)

## 2023-10-30 NOTE — Progress Notes (Signed)
 Established Patient Office Visit   Subjective:  Patient ID: Daniel Blake, male    DOB: 02-Oct-1945  Age: 78 y.o. MRN: [id number]  Chief Complaint  Patient presents with   Fatigue    Started 2 months ago     HPI Encounter Diagnoses  Name Primary?   Other fatigue Yes   Screening for diabetes mellitus    Anxiety    B12 deficiency    70-month history of intermittent fatigue that he describes as having no energy and wanting to sleep all of the time.  There has been no weight loss or or night sweats.  Seen by primary 3 weeks ago but did not think to mention it.  Reports some shortness of breath with physical activity but denies chest pain nausea or diaphoresis.  History of asthma.  Quit smoking 6 years ago.  History of anxiety and androgen deficiency.   Review of Systems  Constitutional:  Positive for malaise/fatigue. Negative for chills, diaphoresis, fever and weight loss.  HENT: Negative.    Eyes:  Negative for blurred vision, discharge and redness.  Respiratory:  Positive for shortness of breath. Negative for cough.   Cardiovascular: Negative.  Negative for chest pain.  Gastrointestinal:  Negative for abdominal pain.  Genitourinary: Negative.   Musculoskeletal: Negative.  Negative for myalgias.  Skin:  Negative for rash.  Neurological:  Negative for tingling, loss of consciousness and weakness.  Endo/Heme/Allergies:  Negative for polydipsia.     Current Outpatient Medications:    ALPRAZolam  (XANAX ) 0.25 MG tablet, TAKE 1 TABLET BY MOUTH TWICE DAILY AS NEEDED FOR SLEEP OR ANXIETY, Disp: 60 tablet, Rfl: 1   Ascorbic Acid (VITAMIN C PO), Take 1 tablet by mouth daily., Disp: , Rfl:    cholecalciferol  (VITAMIN D ) 1000 units tablet, Take 1,000 Units by mouth daily., Disp: , Rfl:    ezetimibe  (ZETIA ) 10 MG tablet, TAKE 1 TABLET BY MOUTH DAILY, Disp: 90 tablet, Rfl: 0   omega-3 acid ethyl esters (LOVAZA ) 1 g capsule, Take 1 g by mouth daily., Disp: , Rfl:    tamsulosin  (FLOMAX ) 0.4 MG  CAPS capsule, TAKE 1 CAPSULE(0.4 MG) BY MOUTH DAILY, Disp: 90 capsule, Rfl: 0   acetaminophen  (TYLENOL ) 500 MG tablet, Take 2 tablets (1,000 mg total) by mouth every 6 (six) hours as needed. (Patient not taking: Reported on 10/30/2023), Disp: , Rfl:    EPINEPHrine  0.3 mg/0.3 mL IJ SOAJ injection, Inject 0.3 mLs (0.3 mg total) into the muscle as needed for anaphylaxis. INJECT 0.3ML INTO THE MUSCLE ONCE FOR ONE DOSE AS DIRECTED (Patient not taking: Reported on 10/30/2023), Disp: 1 each, Rfl: 4   moxifloxacin (VIGAMOX) 0.5 % ophthalmic solution, Place 1 drop into the left eye in the morning, at noon, in the evening, and at bedtime., Disp: , Rfl:    polyethylene glycol powder (GLYCOLAX /MIRALAX ) 17 GM/SCOOP powder, Take 1 capful (17 g) with water by mouth 2 (two) times daily. (Patient not taking: Reported on 10/30/2023), Disp: 238 g, Rfl: 0   prednisoLONE  acetate (PRED FORTE ) 1 % ophthalmic suspension, Place 1 drop into the left eye 4 (four) times daily. (Patient not taking: Reported on 10/30/2023), Disp: , Rfl:    senna-docusate (SENOKOT-S) 8.6-50 MG tablet, Take 1 tablet by mouth 2 (two) times daily. (Patient not taking: Reported on 10/30/2023), Disp: 60 tablet, Rfl: 0   vitamin B-12 (CYANOCOBALAMIN ) 100 MCG tablet, Take 100 mcg by mouth daily., Disp: , Rfl:    Objective:     BP 100/70  Pulse 98   Temp 98.3 F (36.8 C) (Temporal)   Ht 6' 1 (1.854 m)   Wt 197 lb 9.6 oz (89.6 kg)   SpO2 98%   BMI 26.07 kg/m  BP Readings from Last 3 Encounters:  10/30/23 100/70  10/10/23 120/82  04/27/23 109/61   Wt Readings from Last 3 Encounters:  10/30/23 197 lb 9.6 oz (89.6 kg)  10/10/23 197 lb (89.4 kg)  04/25/23 200 lb (90.7 kg)      Physical Exam Constitutional:      General: He is not in acute distress.    Appearance: Normal appearance. He is not ill-appearing, toxic-appearing or diaphoretic.  HENT:     Head: Normocephalic and atraumatic.     Right Ear: External ear normal.     Left Ear:  External ear normal.     Mouth/Throat:     Mouth: Mucous membranes are moist.     Pharynx: Oropharynx is clear. No oropharyngeal exudate or posterior oropharyngeal erythema.  Eyes:     General: No scleral icterus.       Right eye: No discharge.        Left eye: No discharge.     Extraocular Movements: Extraocular movements intact.     Conjunctiva/sclera: Conjunctivae normal.     Pupils: Pupils are equal, round, and reactive to light.  Cardiovascular:     Rate and Rhythm: Normal rate and regular rhythm.  Pulmonary:     Effort: Pulmonary effort is normal. No respiratory distress.     Breath sounds: Normal breath sounds.  Abdominal:     General: Bowel sounds are normal.     Tenderness: There is no abdominal tenderness. There is no guarding or rebound.  Musculoskeletal:     Cervical back: No rigidity or tenderness.  Lymphadenopathy:     Cervical: No cervical adenopathy.  Skin:    General: Skin is warm and dry.  Neurological:     Mental Status: He is alert and oriented to person, place, and time.  Psychiatric:        Mood and Affect: Mood normal.        Behavior: Behavior normal.      No results found for any visits on 10/30/23.    The 10-year ASCVD risk score (Arnett DK, et al., 2019) is: 21.7%    Assessment & Plan:   Other fatigue -     CBC with Differential/Platelet -     Comprehensive metabolic panel with GFR  Screening for diabetes mellitus -     Comprehensive metabolic panel with GFR -     Hemoglobin A1c  Anxiety -     TSH  B12 deficiency -     Vitamin B12    Return Schedule follow-up with Dr. Sebastian in the next few weeks.SABRA Elsie Sim Berneta, MD

## 2023-10-31 ENCOUNTER — Telehealth: Payer: Self-pay | Admitting: Family Medicine

## 2023-10-31 NOTE — Telephone Encounter (Signed)
 Pt saw Dr. Berneta 10/30/2023  Copied from CRM [id number]. Topic: Clinical - Request for Lab/Test Order >> Oct 31, 2023  9:55 AM Daniel Blake wrote: Reason for CRM: Patient called in and is asking if his iron can be checked from the labs that he did yesterday or should another order be made. He wanted his iron to be checked due to fatigue. Patient contact is [phone].

## 2023-11-01 ENCOUNTER — Other Ambulatory Visit: Payer: Self-pay | Admitting: Family Medicine

## 2023-11-01 ENCOUNTER — Ambulatory Visit

## 2023-11-01 DIAGNOSIS — R5383 Other fatigue: Secondary | ICD-10-CM

## 2023-11-01 LAB — IRON: Iron: 82 ug/dL (ref 42–165)

## 2023-11-01 NOTE — Addendum Note (Signed)
 Addended by: LAFE FLONNIE HERO on: 11/01/2023 10:04 AM   Modules accepted: Orders

## 2023-11-14 ENCOUNTER — Ambulatory Visit (INDEPENDENT_AMBULATORY_CARE_PROVIDER_SITE_OTHER): Admitting: Family Medicine

## 2023-11-14 ENCOUNTER — Encounter: Payer: Self-pay | Admitting: Family Medicine

## 2023-11-14 VITALS — BP 127/85 | HR 71 | Temp 97.1°F | Resp 18 | Wt 201.2 lb

## 2023-11-14 DIAGNOSIS — K649 Unspecified hemorrhoids: Secondary | ICD-10-CM

## 2023-11-14 DIAGNOSIS — J4520 Mild intermittent asthma, uncomplicated: Secondary | ICD-10-CM | POA: Diagnosis not present

## 2023-11-14 DIAGNOSIS — R5382 Chronic fatigue, unspecified: Secondary | ICD-10-CM

## 2023-11-14 DIAGNOSIS — R0602 Shortness of breath: Secondary | ICD-10-CM

## 2023-11-14 LAB — POC URINALSYSI DIPSTICK (AUTOMATED)
Bilirubin, UA: NEGATIVE
Blood, UA: NEGATIVE
Glucose, UA: NEGATIVE
Ketones, UA: NEGATIVE
Leukocytes, UA: NEGATIVE
Nitrite, UA: NEGATIVE
Protein, UA: NEGATIVE
Spec Grav, UA: <=1.005 — AB (ref 1.010–1.025)
Urobilinogen, UA: 0.2 U/dL
pH, UA: 6 (ref 5.0–8.0)

## 2023-11-14 NOTE — Patient Instructions (Signed)
  VISIT SUMMARY: During your visit, we discussed your chronic fatigue, asthma, potential sleep apnea, and hemorrhoids. We reviewed your recent lab results and screenings, and we have made plans for further evaluations and management of your symptoms.  YOUR PLAN: -CHRONIC FATIGUE: Chronic fatigue means feeling extremely tired and lacking energy for a long time without a clear cause. Your lab work is normal, and there is no anemia or diabetes. We will refer you to a pulmonologist to evaluate your fatigue, asthma, and potential sleep apnea. Please reduce your B12 supplementation to half the current dose.  -ASTHMA WITH CHRONIC ALLERGIC RHINITIS: Asthma is a condition where your airways narrow and swell, making it hard to breathe. Chronic allergic rhinitis is long-term inflammation of the nose due to allergies. You should use your albuterol  inhaler 15-30 minutes before exercise, starting with one puff and increasing to two if needed. We will refer you to a pulmonologist for further asthma management and evaluation of your shortness of breath.  -SUSPECTED SLEEP APNEA: Sleep apnea is a condition where your breathing repeatedly stops and starts during sleep. Due to your fatigue, daytime sleepiness, and history of snoring, we suspect you might have sleep apnea. We will refer you to a pulmonologist for an evaluation and a potential in-lab sleep study.  -HEMORRHOIDS: Hemorrhoids are swollen veins in your lower rectum and anus that can cause pain and discomfort during bowel movements. Continue using Anusol suppositories as needed to manage your symptoms.  -GENERAL HEALTH MAINTENANCE: Your recent lab work showed no anemia, normal iron levels, and slightly elevated B12 due to supplementation. We discussed the importance of lung cancer screening due to your history of smoking. Please schedule your annual CT scan for lung cancer screening.  INSTRUCTIONS: Please follow up with the pulmonologist for a comprehensive  evaluation of your asthma, sleep apnea, and fatigue. Continue using Anusol suppositories as needed for hemorrhoid management. Reduce your B12 supplementation to half the current dose. Schedule your annual CT scan for lung cancer screening. We will provide you with a copy of your recent lab work.

## 2023-11-14 NOTE — Progress Notes (Signed)
 Assessment & Plan   Assessment/Plan:   Assessment and Plan Chronic fatigue Chronic fatigue with no clear etiology. Normal lab work including CBC, CMP, urinalysis, thyroid  function, and iron levels. B12 slightly elevated due to supplementation. No anemia or diabetes. Symptoms include fatigue and sleepiness, especially post-exercise. Possible contribution from age and activity level. Potential role of sleep apnea and asthma in fatigue. - Refer to pulmonologist for further evaluation of fatigue, asthma, and potential sleep apnea. - Reduce B12 supplementation to half the current dose.  Asthma with chronic allergic rhinitis Mild asthma with chronic allergic rhinitis. Reports wheezing and shortness of breath, especially post-exercise. Possible exercise-induced asthma. Humidity and allergies may exacerbate symptoms. Not using albuterol  inhaler adequately. - Advise using albuterol  inhaler 15-30 minutes before exercise, starting with one puff and increasing to two if needed. - Refer to pulmonologist for asthma management and evaluation of shortness of breath.  Suspected sleep apnea Suspected sleep apnea due to fatigue, daytime sleepiness, and nocturia. History of snoring and previous reluctance to undergo home sleep study. Willing to consider in-lab sleep study. - Refer to pulmonologist for evaluation and potential in-lab sleep study for sleep apnea.  Hemorrhoids Hemorrhoids with pain during bowel movements. Currently using Anusol suppositories with some improvement. No diarrhea or significant fluid loss reported. - Continue Anusol suppositories as needed for hemorrhoid management.  General Health Maintenance Discussed recent lab work and screenings. No anemia, normal iron levels, and slightly elevated B12 due to supplementation. Discussed lung cancer screening due to history of smoking. - Schedule annual CT scan for lung cancer screening.  Follow-up Discussed follow-up plans for ongoing  management and evaluation of symptoms. - Provide copy of recent lab work to him. - Coordinate referral to pulmonologist for comprehensive evaluation of asthma, sleep apnea, and fatigue. - Follow up with pulmonologist for results and further management.   There are no discontinued medications.  Return if symptoms worsen or fail to improve.        Subjective:   Encounter date: 11/14/2023  Daniel Blake is a 78 y.o. male who has Epigastric pain; Reflux esophagitis; Elevated cholesterol; Benign prostatic hyperplasia with incomplete bladder emptying; Complex renal cyst; Atherosclerosis of aorta (HCC); Acute otitis externa of right ear; Tympanic membrane disorder, right; Anxiety; Dysfunction of both eustachian tubes; Excessive cerumen in left ear canal; Extrinsic asthma; Vitamin D  deficiency; Cough; Nasal congestion; Allergic rhinitis due to pollen; Abnormal CXR; Age-related cataract; Light headedness; UTI symptoms; Hemorrhoids; Perirectal abscess; Peri-rectal abscess; Constipation; Gastroesophageal reflux disease; Hypertension; Hyperlipidemia; Hypogonadism in male; Chronic vasomotor rhinitis; and OAB (overactive bladder) on their problem list..   He  has a past medical history of Allergy, Anxiety, Arthritis, Asthma, Cataract, Chicken pox, COPD (chronic obstructive pulmonary disease) (HCC), GERD (gastroesophageal reflux disease), Hyperlipidemia, Hypertension, and PUD (peptic ulcer disease).SABRA   He presents with chief complaint of Fatigue (2 week follow up. Pt stated symptoms are still present. //HM due- lung cancer screening ) .   Discussed the use of AI scribe software for clinical note transcription with the patient, who gave verbal consent to proceed.  History of Present Illness   History of Present Illness Daniel Blake is a 78 year old male with asthma who presents with chronic fatigue.  He experiences chronic fatigue with varying intensity, feeling tired and sleepy on some days while  feeling better on others. Despite regular workouts, he lacks energy and feels more fatigued, especially two days post-exercise. Shortness of breath occurs when climbing stairs; the patient reports that if he goes several  days without exercising, he does not experience shortness of breath.  He has a history of asthma and experiences wheezing. He also reports a history of allergies and congestion. He has not been using his albuterol  inhaler regularly. Morning congestion sometimes leads to wheezing.  He underwent colon surgery in December and experiences morning nausea that resolves shortly after waking. No vomiting is reported. He has difficulty with bowel movements due to pain, which has improved with Anusol suppositories for hemorrhoids.  He had cataract surgery in December and feels sleepy while driving, which improves with sunglasses. He has not been tested for sleep apnea but has been accused of snoring. He has not used a home sleep apnea test due to its complexity.  He has a history of smoking and underwent a CT scan for lung cancer screening last year. He is due for another scan. Recent lab work showed a slightly elevated B12 level from supplementation, a hemoglobin A1c of 6.2, and normal iron levels. No anemia is present. A past lab result showed elevated neutrophils, but the recent CBC was normal.   ROS  Past Surgical History:  Procedure Laterality Date   INCISION AND DRAINAGE PERIRECTAL ABSCESS N/A 04/25/2023   Procedure: IRRIGATION AND DEBRIDEMENT PERIRECTAL ABSCESS;  Surgeon: Teresa Lonni HERO, MD;  Location: [facility] OR;  Service: General;  Laterality: N/A;   TONSILLECTOMY  1962    Outpatient Medications Prior to Visit  Medication Sig Dispense Refill   ALPRAZolam  (XANAX ) 0.25 MG tablet TAKE 1 TABLET BY MOUTH TWICE DAILY AS NEEDED FOR SLEEP OR ANXIETY 60 tablet 1   Ascorbic Acid (VITAMIN C PO) Take 1 tablet by mouth daily.     cholecalciferol  (VITAMIN D ) 1000 units tablet Take 1,000  Units by mouth daily.     ezetimibe  (ZETIA ) 10 MG tablet TAKE 1 TABLET BY MOUTH DAILY 90 tablet 0   hydrocortisone (ANUSOL-HC) 25 MG suppository Place 25 mg rectally 2 (two) times daily.     moxifloxacin (VIGAMOX) 0.5 % ophthalmic solution Place 1 drop into the left eye in the morning, at noon, in the evening, and at bedtime.     omega-3 acid ethyl esters (LOVAZA ) 1 g capsule Take 1 g by mouth daily.     tamsulosin  (FLOMAX ) 0.4 MG CAPS capsule TAKE 1 CAPSULE(0.4 MG) BY MOUTH DAILY 90 capsule 0   vitamin B-12 (CYANOCOBALAMIN ) 100 MCG tablet Take 100 mcg by mouth daily.     acetaminophen  (TYLENOL ) 500 MG tablet Take 2 tablets (1,000 mg total) by mouth every 6 (six) hours as needed. (Patient not taking: Reported on 11/14/2023)     EPINEPHrine  0.3 mg/0.3 mL IJ SOAJ injection Inject 0.3 mLs (0.3 mg total) into the muscle as needed for anaphylaxis. INJECT 0.3ML INTO THE MUSCLE ONCE FOR ONE DOSE AS DIRECTED (Patient not taking: Reported on 11/14/2023) 1 each 4   polyethylene glycol powder (GLYCOLAX /MIRALAX ) 17 GM/SCOOP powder Take 1 capful (17 g) with water by mouth 2 (two) times daily. (Patient not taking: Reported on 11/14/2023) 238 g 0   prednisoLONE  acetate (PRED FORTE ) 1 % ophthalmic suspension Place 1 drop into the left eye 4 (four) times daily. (Patient not taking: Reported on 11/14/2023)     senna-docusate (SENOKOT-S) 8.6-50 MG tablet Take 1 tablet by mouth 2 (two) times daily. (Patient not taking: Reported on 11/14/2023) 60 tablet 0   No facility-administered medications prior to visit.    Family History  Problem Relation Age of Onset   Heart disease Mother    Hyperlipidemia Mother  Hypertension Mother    Asthma Father    COPD Father    Hyperlipidemia Father    Hypertension Father    Stroke Father    Arthritis Sister    Early death Sister    Heart disease Brother    Heart disease Brother    Heart attack Brother    Early death Brother     Social History   Socioeconomic History   Marital  status: Married    Spouse name: Not on file   Number of children: Not on file   Years of education: Not on file   Highest education level: Not on file  Occupational History   Not on file  Tobacco Use   Smoking status: Former    Current packs/day: 0.00    Types: Cigarettes    Quit date: 10/19/2017    Years since quitting: 6.0   Smokeless tobacco: Never  Substance and Sexual Activity   Alcohol use: No   Drug use: No   Sexual activity: Never  Other Topics Concern   Not on file  Social History Narrative   Not on file   Social Drivers of Health   Financial Resource Strain: Not on file  Food Insecurity: No Food Insecurity (04/25/2023)   Hunger Vital Sign    Worried About Running Out of Food in the Last Year: Never true    Ran Out of Food in the Last Year: Never true  Transportation Needs: No Transportation Needs (04/25/2023)   PRAPARE - Administrator, Civil Service (Medical): No    Lack of Transportation (Non-Medical): No  Physical Activity: Not on file  Stress: Not on file  Social Connections: Moderately Isolated (04/25/2023)   Social Connection and Isolation Panel    Frequency of Communication with Friends and Family: Three times a week    Frequency of Social Gatherings with Friends and Family: Three times a week    Attends Religious Services: Never    Active Member of Clubs or Organizations: No    Attends Banker Meetings: Never    Marital Status: Married  Catering manager Violence: Not At Risk (04/25/2023)   Humiliation, Afraid, Rape, and Kick questionnaire    Fear of Current or Ex-Partner: No    Emotionally Abused: No    Physically Abused: No    Sexually Abused: No                                                                                                  Objective:  Physical Exam: BP 127/85 (BP Location: Right Arm, Patient Position: Sitting, Cuff Size: Large) Comment: recheck reading  Pulse 71   Temp (!) 97.1 F (36.2 C) (Temporal)    Resp 18   Wt 201 lb 3.2 oz (91.3 kg)   SpO2 97%   BMI 26.55 kg/m   Wt Readings from Last 3 Encounters:  11/14/23 201 lb 3.2 oz (91.3 kg)  10/30/23 197 lb 9.6 oz (89.6 kg)  10/10/23 197 lb (89.4 kg)    Physical Exam    Physical Exam  No results found.  Recent  Results (from the past 2160 hours)  CBC with Differential/Platelet     Status: None   Collection Time: 10/30/23 11:01 AM  Result Value Ref Range   WBC 6.0 4.0 - 10.5 K/uL   RBC 4.91 4.22 - 5.81 Mil/uL   Hemoglobin 15.1 13.0 - 17.0 g/dL   HCT 55.1 60.9 - 47.9 %   MCV 91.1 78.0 - 100.0 fl   MCHC 33.6 30.0 - 36.0 g/dL   RDW 86.9 88.4 - 84.4 %   Platelets 294.0 150.0 - 400.0 K/uL   Neutrophils Relative % 59.5 43.0 - 77.0 %   Lymphocytes Relative 26.0 12.0 - 46.0 %   Monocytes Relative 10.6 3.0 - 12.0 %   Eosinophils Relative 3.3 0.0 - 5.0 %   Basophils Relative 0.6 0.0 - 3.0 %   Neutro Abs 3.6 1.4 - 7.7 K/uL   Lymphs Abs 1.6 0.7 - 4.0 K/uL   Monocytes Absolute 0.6 0.1 - 1.0 K/uL   Eosinophils Absolute 0.2 0.0 - 0.7 K/uL   Basophils Absolute 0.0 0.0 - 0.1 K/uL  Comprehensive metabolic panel with GFR     Status: None   Collection Time: 10/30/23 11:01 AM  Result Value Ref Range   Sodium 142 135 - 145 mEq/L   Potassium 4.1 3.5 - 5.1 mEq/L   Chloride 105 96 - 112 mEq/L   CO2 29 19 - 32 mEq/L   Glucose, Bld 97 70 - 99 mg/dL   BUN 16 6 - 23 mg/dL   Creatinine, Ser 9.03 0.40 - 1.50 mg/dL   Total Bilirubin 0.5 0.2 - 1.2 mg/dL   Alkaline Phosphatase 54 39 - 117 U/L   AST 17 0 - 37 U/L   ALT 15 0 - 53 U/L   Total Protein 6.7 6.0 - 8.3 g/dL   Albumin 4.5 3.5 - 5.2 g/dL   GFR 24.77 >39.99 mL/min    Comment: Calculated using the CKD-EPI Creatinine Equation (2021)   Calcium 9.3 8.4 - 10.5 mg/dL  TSH     Status: None   Collection Time: 10/30/23 11:01 AM  Result Value Ref Range   TSH 2.19 0.35 - 5.50 uIU/mL  Hemoglobin A1c     Status: None   Collection Time: 10/30/23 11:01 AM  Result Value Ref Range   Hgb A1c MFr  Bld 6.2 4.6 - 6.5 %    Comment: Glycemic Control Guidelines for People with Diabetes:Non Diabetic:  <6%Goal of Therapy: <7%Additional Action Suggested:  >8%   Vitamin B12     Status: Abnormal   Collection Time: 10/30/23 11:01 AM  Result Value Ref Range   Vitamin B-12 1,090 (H) 211 - 911 pg/mL  Iron     Status: None   Collection Time: 11/01/23 10:05 AM  Result Value Ref Range   Iron 82 42 - 165 ug/dL  POCT Urinalysis Dipstick (Automated)     Status: Abnormal   Collection Time: 11/14/23  9:00 AM  Result Value Ref Range   Color, UA clear    Clarity, UA yellow    Glucose, UA Negative Negative   Bilirubin, UA NEGATIVE    Ketones, UA NEGATIVE    Spec Grav, UA <=1.005 (A) 1.010 - 1.025   Blood, UA NEGATIVE    pH, UA 6.0 5.0 - 8.0   Protein, UA Negative Negative   Urobilinogen, UA 0.2 0.2 or 1.0 E.U./dL   Nitrite, UA NEGATIVE    Leukocytes, UA Negative Negative        Beverley Adine Hummer, MD, MS

## 2023-11-23 ENCOUNTER — Telehealth: Payer: Self-pay | Admitting: Family Medicine

## 2023-11-23 NOTE — Telephone Encounter (Signed)
 Copied from CRM #[id number]. Topic: Referral - Question >> Nov 23, 2023  3:17 PM Robinson H wrote: Reason for CRM: Patient wants to see a Cardiologist to be checked for A-Fib, has 2 brothers and a sister who has it and his sister is going through the same thing now. Advised patient of referral in system to Pulmonary, states he will see the Pulmonary doctor after.  Ronav [phone]

## 2023-11-27 NOTE — Telephone Encounter (Signed)
 Patient stated he will see pulmonary specialist at this time; appt scheduled for November

## 2023-12-29 ENCOUNTER — Ambulatory Visit

## 2023-12-29 VITALS — BP 156/93 | HR 75 | Ht 73.0 in | Wt 203.0 lb

## 2023-12-29 DIAGNOSIS — J301 Allergic rhinitis due to pollen: Secondary | ICD-10-CM | POA: Diagnosis not present

## 2023-12-29 DIAGNOSIS — R5383 Other fatigue: Secondary | ICD-10-CM

## 2023-12-29 DIAGNOSIS — J4541 Moderate persistent asthma with (acute) exacerbation: Secondary | ICD-10-CM | POA: Insufficient documentation

## 2023-12-29 DIAGNOSIS — J4531 Mild persistent asthma with (acute) exacerbation: Secondary | ICD-10-CM | POA: Diagnosis not present

## 2023-12-29 DIAGNOSIS — Z87891 Personal history of nicotine dependence: Secondary | ICD-10-CM

## 2023-12-29 MED ORDER — AIRSUPRA 90-80 MCG/ACT IN AERO: 1.0000 | INHALATION_SPRAY | Freq: Four times a day (QID) | RESPIRATORY_TRACT | 3 refills | Status: AC | PRN

## 2023-12-29 MED ORDER — BUDESONIDE-FORMOTEROL FUMARATE 160-4.5 MCG/ACT IN AERO: 2.0000 | INHALATION_SPRAY | Freq: Two times a day (BID) | RESPIRATORY_TRACT | 6 refills | Status: AC

## 2023-12-29 NOTE — Assessment & Plan Note (Signed)
 Advised stopping atrovent  which usually helps more runny nose.  Take flonase and prn saline spray. Both OTC.

## 2023-12-29 NOTE — Progress Notes (Signed)
 New Patient Pulmonology Office Visit   Subjective:  Patient ID: Daniel Blake, male    DOB: Aug 01, 1945  MRN: [id number]  Referred by: Daniel Beverley NOVAK, MD  CC:  Chief Complaint  Patient presents with   Consult    Pt states he stays up all night coughing and has not been able to sleep. Pt currently has bronchitis.  Never had a sleep study. Pt does not snore.  Pt states no SOB other than the bronchitis.     HPI Daniel Blake is a 78 y.o. male with HTN, allergic rhinitis, asthma, GERD, HLD . Presents for evaluation of asthma and concern for fatigue.   Went to ER for coughing and was given zpak for 4 days and given tapering dose of steroids.  CXR: has spot on LL lung. He has brought his disk.   1 month ago started having DOE. Now better on above. Sister was sick.  Has nasal congestion. Given atrovent  spray. Has used flonase in the past.   ASTHMA:  FH: sister.  Triggers: ragweed and exercise.  Intubated: no Last steroid use: using it currently today is 4th day. Prior to that it was last year.  Times albuterol  used: 2 puff 2-3 times a day. Unsure if it helps. Prior to 1 month ago rarely using it.  Treatment: only albuterol  inh.   Mouth breather: mouth.  Preferred sleeping position: side.   Sleep related Symptoms:  Snoring- yes Witnessed apnea- no Gasping/choking- no morning HA/dry mouth- no/no tired on awakening, excessive daytime sleepiness- denies. But reportedly in patients chart he has chronic fatigue.   Lung Health: Functional status: unlimited.  Covid vaccine: yes.  Influenza vaccine: yes Pneumonococcal vaccine: yes.  Smoking: ex smoker. Quit 2010 1pk/d 40 yrs.  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 No exposures. No pets. Used to have dog 10 years ago.      12/29/2023    9:00 AM  Results of the Epworth flowsheet  Sitting and reading 1  Watching TV 1  Sitting, inactive in a public place (e.g. a theatre or a meeting) 1  As a passenger in a car for an hour  without a break 1  Lying down to rest in the afternoon when circumstances permit 2  Sitting and talking to someone 1  Sitting quietly after a lunch without alcohol 1  In a car, while stopped for a few minutes in traffic 1  Total score 9    Allergies: Bee venom, Amlodipine, Pravastatin, and Rosuvastatin  Current Outpatient Medications:    Albuterol -Budesonide  (AIRSUPRA ) 90-80 MCG/ACT AERO, Inhale 1 puff into the lungs every 6 (six) hours as needed (sob, wheezing,)., Disp: 32.1 g, Rfl: 3   ALPRAZolam  (XANAX ) 0.25 MG tablet, TAKE 1 TABLET BY MOUTH TWICE DAILY AS NEEDED FOR SLEEP OR ANXIETY (Patient taking differently: TAKE 1 TABLET BY MOUTH TWICE DAILY AS NEEDED FOR SLEEP OR ANXIETY), Disp: 60 tablet, Rfl: 1   azithromycin (ZITHROMAX) 250 MG tablet, Take by mouth., Disp: , Rfl:    budesonide -formoterol  (SYMBICORT ) 160-4.5 MCG/ACT inhaler, Inhale 2 puffs into the lungs in the morning and at bedtime., Disp: 1 each, Rfl: 6   cholecalciferol  (VITAMIN D ) 1000 units tablet, Take 1,000 Units by mouth daily., Disp: , Rfl:    EPINEPHrine  0.3 mg/0.3 mL IJ SOAJ injection, Inject 0.3 mLs (0.3 mg total) into the muscle as needed for anaphylaxis. INJECT 0.3ML INTO THE MUSCLE ONCE FOR ONE DOSE AS DIRECTED, Disp: 1 each, Rfl: 4   ezetimibe  (ZETIA ) 10 MG tablet, TAKE  1 TABLET BY MOUTH DAILY, Disp: 90 tablet, Rfl: 0   ipratropium (ATROVENT ) 0.06 % nasal spray, Place 2 sprays into both nostrils 3 (three) times daily., Disp: , Rfl:    omega-3 acid ethyl esters (LOVAZA ) 1 g capsule, Take 1 g by mouth daily., Disp: , Rfl:    predniSONE  (DELTASONE ) 10 MG tablet, Take by mouth., Disp: , Rfl:    PROAIR  RESPICLICK 108 (90 Base) MCG/ACT AEPB, Inhale into the lungs., Disp: , Rfl:    tamsulosin  (FLOMAX ) 0.4 MG CAPS capsule, TAKE 1 CAPSULE(0.4 MG) BY MOUTH DAILY, Disp: 90 capsule, Rfl: 0   vitamin B-12 (CYANOCOBALAMIN ) 100 MCG tablet, Take 100 mcg by mouth daily., Disp: , Rfl:    acetaminophen  (TYLENOL ) 500 MG tablet, Take 2  tablets (1,000 mg total) by mouth every 6 (six) hours as needed. (Patient not taking: Reported on 12/29/2023), Disp: , Rfl:    Ascorbic Acid (VITAMIN C PO), Take 1 tablet by mouth daily. (Patient not taking: Reported on 12/29/2023), Disp: , Rfl:    hydrocortisone (ANUSOL-HC) 25 MG suppository, Place 25 mg rectally 2 (two) times daily. (Patient not taking: Reported on 12/29/2023), Disp: , Rfl:    moxifloxacin (VIGAMOX) 0.5 % ophthalmic solution, Place 1 drop into the left eye in the morning, at noon, in the evening, and at bedtime. (Patient not taking: Reported on 12/29/2023), Disp: , Rfl:    polyethylene glycol powder (GLYCOLAX /MIRALAX ) 17 GM/SCOOP powder, Take 1 capful (17 g) with water by mouth 2 (two) times daily. (Patient not taking: Reported on 12/29/2023), Disp: 238 g, Rfl: 0   prednisoLONE  acetate (PRED FORTE ) 1 % ophthalmic suspension, Place 1 drop into the left eye 4 (four) times daily. (Patient not taking: Reported on 12/29/2023), Disp: , Rfl:    senna-docusate (SENOKOT-S) 8.6-50 MG tablet, Take 1 tablet by mouth 2 (two) times daily. (Patient not taking: Reported on 12/29/2023), Disp: 60 tablet, Rfl: 0 Past Medical History:  Diagnosis Date   Allergy    Anxiety    Arthritis    Asthma    Cataract    Chicken pox    COPD (chronic obstructive pulmonary disease) (HCC)    GERD (gastroesophageal reflux disease)    Hyperlipidemia    Hypertension    PUD (peptic ulcer disease)    Past Surgical History:  Procedure Laterality Date   INCISION AND DRAINAGE PERIRECTAL ABSCESS N/A 04/25/2023   Procedure: IRRIGATION AND DEBRIDEMENT PERIRECTAL ABSCESS;  Surgeon: Teresa Lonni HERO, MD;  Location: [facility] OR;  Service: General;  Laterality: N/A;   TONSILLECTOMY  1962   Family History  Problem Relation Age of Onset   Heart disease Mother    Hyperlipidemia Mother    Hypertension Mother    Asthma Father    COPD Father    Hyperlipidemia Father    Hypertension Father    Stroke Father    Arthritis Sister     Early death Sister    Heart disease Brother    Heart disease Brother    Heart attack Brother    Early death Brother    Social History   Socioeconomic History   Marital status: Married    Spouse name: Not on file   Number of children: Not on file   Years of education: Not on file   Highest education level: Not on file  Occupational History   Not on file  Tobacco Use   Smoking status: Former    Current packs/day: 0.00    Types: Cigarettes    Quit  date: 10/19/2017    Years since quitting: 6.1   Smokeless tobacco: Never   Tobacco comments:    Smoked off and on for over 45 years. Smoked about 1 ppd  Substance and Sexual Activity   Alcohol use: No   Drug use: No   Sexual activity: Never  Other Topics Concern   Not on file  Social History Narrative   Not on file   Social Drivers of Health   Financial Resource Strain: Not on file  Food Insecurity: No Food Insecurity (04/25/2023)   Hunger Vital Sign    Worried About Running Out of Food in the Last Year: Never true    Ran Out of Food in the Last Year: Never true  Transportation Needs: No Transportation Needs (04/25/2023)   PRAPARE - Administrator, Civil Service (Medical): No    Lack of Transportation (Non-Medical): No  Physical Activity: Not on file  Stress: Not on file  Social Connections: Moderately Isolated (04/25/2023)   Social Connection and Isolation Panel    Frequency of Communication with Friends and Family: Three times a week    Frequency of Social Gatherings with Friends and Family: Three times a week    Attends Religious Services: Never    Active Member of Clubs or Organizations: No    Attends Banker Meetings: Never    Marital Status: Married  Catering manager Violence: Not At Risk (04/25/2023)   Humiliation, Afraid, Rape, and Kick questionnaire    Fear of Current or Ex-Partner: No    Emotionally Abused: No    Physically Abused: No    Sexually Abused: No       Objective:  BP  (!) 155/90   Pulse 75   Ht 6' 1 (1.854 m)   Wt 203 lb (92.1 kg)   SpO2 94% Comment: RA  BMI 26.78 kg/m  BMI Readings from Last 3 Encounters:  12/29/23 26.78 kg/m  11/14/23 26.55 kg/m  10/30/23 26.07 kg/m   SpO2 Readings from Last 3 Encounters:  12/29/23 94%  11/14/23 97%  10/30/23 98%    General: not in distress patient appearing healthy Lungs: clear to auscultation bilaterally.  Heart: regular rate rhythm, no murmur appreciated.  Abdomen: non tender, non distended. Normal BS.  Neuro: axox3.  Moving all extremities.    Diagnostic Review:  Last CBC Lab Results  Component Value Date   WBC 6.0 10/30/2023   HGB 15.1 10/30/2023   HCT 44.8 10/30/2023   MCV 91.1 10/30/2023   MCH 31.3 04/27/2023   RDW 13.0 10/30/2023   PLT 294.0 10/30/2023   Last metabolic panel Lab Results  Component Value Date   GLUCOSE 97 10/30/2023   NA 142 10/30/2023   K 4.1 10/30/2023   CL 105 10/30/2023   CO2 29 10/30/2023   BUN 16 10/30/2023   CREATININE 0.96 10/30/2023   GFR 75.22 10/30/2023   CALCIUM 9.3 10/30/2023   PROT 6.7 10/30/2023   ALBUMIN 4.5 10/30/2023   BILITOT 0.5 10/30/2023   ALKPHOS 54 10/30/2023   AST 17 10/30/2023   ALT 15 10/30/2023   ANIONGAP 11 04/27/2023       Assessment & Plan:   Assessment & Plan Other fatigue Normal CBC, TSH. However patient denies having fatigue. Patient says he does not believe he has sleep apnea and would not like to get tested for it.  Seasonal allergic rhinitis due to pollen Advised stopping atrovent  which usually helps more runny nose.  Take flonase and prn saline  spray. Both OTC.  Mild persistent asthma with acute exacerbation Based on symptoms he has mild asthma currently in exacerbation. He did bring his CXR CD to visit but I am not able to view it at present. He says there is some abn on LLL per physician which he has been told not to worry about. I will upload hte images and take a look at it when images are uploaded. Also  advised him to get CT scan CD from [facility] to get uploaded in our system.  - stop proair .  - start symbicort  160 2 puff BID.  - take airsupra  prn.  - based on CT scan report from [facility] he has emphysema. We will get PFTs.  - advised to stop prednisone  which was prescribed to him for asthma exacerbation sooner once he feels back at his baseline.   Orders Placed This Encounter  Procedures   Pulmonary Function Test      Return in about 8 weeks (around 02/23/2024).   Jonathon Tan, MD

## 2023-12-29 NOTE — Patient Instructions (Addendum)
 Notification of test results are managed in the following manner: If there are any recommendations or changes to the plan of care discussed in office today, we will contact you and let you know what they are. If you do not hear from us , then your results are normal/expected and you can view them through your MyChart account, or a letter will be sent to you. Thank you again for trusting us  with your care [facility].

## 2024-01-01 ENCOUNTER — Inpatient Hospital Stay
Admission: RE | Admit: 2024-01-01 | Discharge: 2024-01-01 | Disposition: A | Discharge status: Home / Self Care | Payer: Self-pay | Source: Ambulatory Visit

## 2024-01-01 ENCOUNTER — Telehealth: Payer: Self-pay

## 2024-01-01 DIAGNOSIS — J4531 Mild persistent asthma with (acute) exacerbation: Secondary | ICD-10-CM

## 2024-01-01 NOTE — Telephone Encounter (Signed)
 Received 2 CXR CDs from pt at his office visit. Placed them in the outgoing mail to [facility]. Both CDs had pt's name, DOB, and MRN. Pt does not need CD back.

## 2024-01-17 ENCOUNTER — Telehealth: Payer: Self-pay

## 2024-01-17 NOTE — Telephone Encounter (Signed)
 Copied from CRM #[id number]. Topic: Clinical - Lab/Test Results >> Jan 17, 2024  3:44 PM Thersia BROCKS wrote: Reason for CRM: Patient called in regarding results dated for 08/05, patient would like for someone to give him a callback to explain this as he stated he saw abnormal and wanted to know what that meant.

## 2024-02-26 ENCOUNTER — Telehealth: Payer: Self-pay

## 2024-02-26 NOTE — Telephone Encounter (Signed)
 Copied from CRM #[id number]. Topic: Referral - Question >> Feb 23, 2024 12:14 PM Aisha D wrote: Reason for CRM: Pt is requesting a referral to [facility] due to spots on his face. Pt stated that he already spoke with the office and they stated they would see him but he needs a referral first. Pt would like a callback with an update.    ----------------------------------------------------------------------- From previous Reason for Contact - Scheduling: Patient/patient representative is calling to schedule an appointment. Refer to attachments for appointment information.

## 2024-03-04 ENCOUNTER — Ambulatory Visit: Admitting: *Deleted

## 2024-03-04 ENCOUNTER — Ambulatory Visit (INDEPENDENT_AMBULATORY_CARE_PROVIDER_SITE_OTHER)

## 2024-03-04 VITALS — BP 124/78 | HR 68 | Temp 97.4°F | Ht 73.0 in | Wt 203.0 lb

## 2024-03-04 DIAGNOSIS — J4520 Mild intermittent asthma, uncomplicated: Secondary | ICD-10-CM

## 2024-03-04 DIAGNOSIS — J4531 Mild persistent asthma with (acute) exacerbation: Secondary | ICD-10-CM

## 2024-03-04 DIAGNOSIS — J449 Chronic obstructive pulmonary disease, unspecified: Secondary | ICD-10-CM

## 2024-03-04 LAB — PULMONARY FUNCTION TEST
DL/VA % pred: 56 %
DL/VA: 2.2 ml/min/mmHg/L
DLCO cor % pred: 55 %
DLCO cor: 14.8 ml/min/mmHg
DLCO unc % pred: 55 %
DLCO unc: 14.8 ml/min/mmHg
FEF 25-75 Post: 2.26 L/s
FEF 25-75 Pre: 1.51 L/s
FEF2575-%Change-Post: 50 %
FEF2575-%Pred-Post: 97 %
FEF2575-%Pred-Pre: 65 %
FEV1-%Change-Post: 10 %
FEV1-%Pred-Post: 92 %
FEV1-%Pred-Pre: 83 %
FEV1-Post: 3.04 L
FEV1-Pre: 2.75 L
FEV1FVC-%Change-Post: 1 %
FEV1FVC-%Pred-Pre: 91 %
FEV6-%Change-Post: 7 %
FEV6-%Pred-Post: 102 %
FEV6-%Pred-Pre: 94 %
FEV6-Post: 4.4 L
FEV6-Pre: 4.08 L
FEV6FVC-%Change-Post: -1 %
FEV6FVC-%Pred-Post: 103 %
FEV6FVC-%Pred-Pre: 104 %
FVC-%Change-Post: 9 %
FVC-%Pred-Post: 99 %
FVC-%Pred-Pre: 90 %
FVC-Post: 4.55 L
FVC-Pre: 4.17 L
Post FEV1/FVC ratio: 67 %
Post FEV6/FVC ratio: 97 %
Pre FEV1/FVC ratio: 66 %
Pre FEV6/FVC Ratio: 98 %
RV % pred: 114 %
RV: 3.21 L
TLC % pred: 101 %
TLC: 7.8 L

## 2024-03-04 NOTE — Patient Instructions (Signed)
  VISIT SUMMARY:  Today, you had a follow-up appointment to discuss your asthma and COPD, and you underwent a lung function test. The test showed mild obstruction with moderate reduction in lung capacity, but you responded well to a bronchodilator. We also reviewed a stable pulmonary nodule in your left lower lung, which does not require further imaging or intervention.  YOUR PLAN:  -CHRONIC OBSTRUCTIVE PULMONARY DISEASE (COPD): COPD is a chronic lung condition often caused by smoking, leading to breathing difficulties and fatigue during heavy exercise. You should continue your current exercise routine but avoid overexertion. Use your Symbicort  inhaler as needed, especially during heavy exercise if you experience shortness of breath. Remember to rinse your mouth after using the inhaler to prevent any oral side effects.  -STABLE PULMONARY NODULE, LEFT LOWER LUNG: A pulmonary nodule is a small growth in the lung. Your 4 mm nodule in the left lower lung has remained stable on previous CT scans, and at your age, you no longer need lung cancer screening. No further imaging or intervention is required for this nodule.                       Contains text generated by Abridge.                                 Contains text generated by Abridge.

## 2024-03-04 NOTE — Progress Notes (Signed)
 Full PFT performed today.

## 2024-03-04 NOTE — Patient Instructions (Signed)
 Full PFT performed today.

## 2024-03-04 NOTE — Progress Notes (Signed)
 New Patient Pulmonology Office Visit   Subjective:  Patient ID: Daniel Blake, male    DOB: Aug 31, 1945  MRN: [id number]  Referred by: Theodoro Lakes, MD  CC:  Chief Complaint  Patient presents with   Asthma    Asthma and allergic rhinitis persistent.  Review PFT from today    Asthma His past medical history is significant for asthma.   Daniel Blake is a 78 y.o. male with HTN, allergic rhinitis, asthma, GERD, HLD . Presents for evaluation of asthma and concern for fatigue.   12/29/23: Mild asthma>symbicort .   Discussed the use of AI scribe software for clinical note transcription with the patient, who gave verbal consent to proceed.  History of Present Illness   Daniel Blake is a 78 year old male with asthma and COPD who presents for follow-up and lung function testing.  He underwent a lung function test today, which showed mild obstruction with moderate to reduced DLCO and a positive bronchodilator response. He uses his inhaler occasionally but not consistently due to concerns about overuse. Shortness of breath occurs primarily during heavy exercise or outdoor work, such as raking and seeding his yard, causing fatigue and dyspnea. He uses the inhaler before getting on the treadmill but is unsure of its efficacy. He feels good immediately after exercise but then experiences a relapse in energy.  A four millimeter nodule was identified in the left lower lung on a CT scan of the abdomen in January 2025. He has had two lung cancer screenings in the past, approximately in 2021 and 2022, with no changes noted.  He has a history of smoking. No consistent shortness of breath at rest.       ASTHMA:  FH: sister.  Triggers: ragweed and exercise.  Intubated: no Last steroid use: using it currently today is 4th day. Prior to that it was last year.  Times albuterol  used: 2 puff 2-3 times a day. Unsure if it helps. Prior to 1 month ago rarely using it.  Treatment: only albuterol  inh>symbicort  now.    Lung Health: Functional status: unlimited.  Covid vaccine: yes.  Influenza vaccine: yes Pneumonococcal vaccine: yes.  Smoking: ex smoker. Quit 2010 1pk/d 40 yrs.  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 No exposures. No pets. Used to have dog 10 years ago.   Test: CT abdo Jan 2025> 4 mm LLL nodule.  PFT 03/04/24: Mild COPD w +BD response. Mod reduction in DLCO.   CXR from outside reviewed by me and shows emphysematous changes.      12/29/2023    9:00 AM  Results of the Epworth flowsheet  Sitting and reading 1  Watching TV 1  Sitting, inactive in a public place (e.g. a theatre or a meeting) 1  As a passenger in a car for an hour without a break 1  Lying down to rest in the afternoon when circumstances permit 2  Sitting and talking to someone 1  Sitting quietly after a lunch without alcohol 1  In a car, while stopped for a few minutes in traffic 1  Total score 9    Allergies: Bee venom, Amlodipine, Pravastatin, and Rosuvastatin  Current Outpatient Medications:    acetaminophen  (TYLENOL ) 500 MG tablet, Take 2 tablets (1,000 mg total) by mouth every 6 (six) hours as needed., Disp: , Rfl:    Albuterol -Budesonide  (AIRSUPRA ) 90-80 MCG/ACT AERO, Inhale 1 puff into the lungs every 6 (six) hours as needed (sob, wheezing,)., Disp: 32.1 g, Rfl: 3   ALPRAZolam  (XANAX ) 0.25 MG tablet,  TAKE 1 TABLET BY MOUTH TWICE DAILY AS NEEDED FOR SLEEP OR ANXIETY (Patient taking differently: TAKE 1 TABLET BY MOUTH TWICE DAILY AS NEEDED FOR SLEEP OR ANXIETY), Disp: 60 tablet, Rfl: 1   azithromycin (ZITHROMAX) 250 MG tablet, Take by mouth., Disp: , Rfl:    budesonide -formoterol  (SYMBICORT ) 160-4.5 MCG/ACT inhaler, Inhale 2 puffs into the lungs in the morning and at bedtime., Disp: 1 each, Rfl: 6   cholecalciferol  (VITAMIN D ) 1000 units tablet, Take 1,000 Units by mouth daily., Disp: , Rfl:    EPINEPHrine  0.3 mg/0.3 mL IJ SOAJ injection, Inject 0.3 mLs (0.3 mg total) into the muscle as needed for  anaphylaxis. INJECT 0.3ML INTO THE MUSCLE ONCE FOR ONE DOSE AS DIRECTED, Disp: 1 each, Rfl: 4   ezetimibe  (ZETIA ) 10 MG tablet, TAKE 1 TABLET BY MOUTH DAILY, Disp: 90 tablet, Rfl: 0   ipratropium (ATROVENT ) 0.06 % nasal spray, Place 2 sprays into both nostrils 3 (three) times daily., Disp: , Rfl:    omega-3 acid ethyl esters (LOVAZA ) 1 g capsule, Take 1 g by mouth daily., Disp: , Rfl:    predniSONE  (DELTASONE ) 10 MG tablet, Take by mouth., Disp: , Rfl:    PROAIR  RESPICLICK 108 (90 Base) MCG/ACT AEPB, Inhale into the lungs., Disp: , Rfl:    tamsulosin  (FLOMAX ) 0.4 MG CAPS capsule, TAKE 1 CAPSULE(0.4 MG) BY MOUTH DAILY, Disp: 90 capsule, Rfl: 0   vitamin B-12 (CYANOCOBALAMIN ) 100 MCG tablet, Take 100 mcg by mouth daily., Disp: , Rfl:  Past Medical History:  Diagnosis Date   Allergy    Anxiety    Arthritis    Asthma    Cataract    Chicken pox    COPD (chronic obstructive pulmonary disease) (HCC)    GERD (gastroesophageal reflux disease)    Hyperlipidemia    Hypertension    PUD (peptic ulcer disease)    Past Surgical History:  Procedure Laterality Date   INCISION AND DRAINAGE PERIRECTAL ABSCESS N/A 04/25/2023   Procedure: IRRIGATION AND DEBRIDEMENT PERIRECTAL ABSCESS;  Surgeon: Teresa Lonni HERO, MD;  Location: [facility] OR;  Service: General;  Laterality: N/A;   TONSILLECTOMY  1962   Family History  Problem Relation Age of Onset   Heart disease Mother    Hyperlipidemia Mother    Hypertension Mother    Asthma Father    COPD Father    Hyperlipidemia Father    Hypertension Father    Stroke Father    Arthritis Sister    Early death Sister    Heart disease Brother    Heart disease Brother    Heart attack Brother    Early death Brother    Social History   Socioeconomic History   Marital status: Married    Spouse name: Not on file   Number of children: Not on file   Years of education: Not on file   Highest education level: Not on file  Occupational History   Not on file   Tobacco Use   Smoking status: Former    Current packs/day: 0.00    Types: Cigarettes    Quit date: 10/19/2017    Years since quitting: 6.3   Smokeless tobacco: Never   Tobacco comments:    Smoked off and on for over 45 years. Smoked about 1 ppd  Substance and Sexual Activity   Alcohol use: No   Drug use: No   Sexual activity: Never  Other Topics Concern   Not on file  Social History Narrative   Not on  file   Social Drivers of Health   Financial Resource Strain: Not on file  Food Insecurity: No Food Insecurity (04/25/2023)   Hunger Vital Sign    Worried About Running Out of Food in the Last Year: Never true    Ran Out of Food in the Last Year: Never true  Transportation Needs: No Transportation Needs (04/25/2023)   PRAPARE - Administrator, Civil Service (Medical): No    Lack of Transportation (Non-Medical): No  Physical Activity: Not on file  Stress: Not on file  Social Connections: Moderately Isolated (04/25/2023)   Social Connection and Isolation Panel    Frequency of Communication with Friends and Family: Three times a week    Frequency of Social Gatherings with Friends and Family: Three times a week    Attends Religious Services: Never    Active Member of Clubs or Organizations: No    Attends Banker Meetings: Never    Marital Status: Married  Catering Manager Violence: Not At Risk (04/25/2023)   Humiliation, Afraid, Rape, and Kick questionnaire    Fear of Current or Ex-Partner: No    Emotionally Abused: No    Physically Abused: No    Sexually Abused: No       Objective:  BP 124/78   Pulse 68   Temp (!) 97.4 F (36.3 C) (Oral)   Ht 6' 1 (1.854 m)   Wt 203 lb (92.1 kg)   SpO2 97% Comment: room air  BMI 26.78 kg/m  BMI Readings from Last 3 Encounters:  03/04/24 26.78 kg/m  12/29/23 26.78 kg/m  11/14/23 26.55 kg/m   SpO2 Readings from Last 3 Encounters:  03/04/24 97%  12/29/23 94%  11/14/23 97%    General: not in distress  patient appearing healthy. Elderly gentleman.  Lungs: clear to auscultation bilaterally.  Heart: regular rate rhythm, no murmur appreciated.  Abdomen: non tender, non distended. Normal BS.  Neuro: axox3.  Moving all extremities.    Diagnostic Review:  Last CBC Lab Results  Component Value Date   WBC 6.0 10/30/2023   HGB 15.1 10/30/2023   HCT 44.8 10/30/2023   MCV 91.1 10/30/2023   MCH 31.3 04/27/2023   RDW 13.0 10/30/2023   PLT 294.0 10/30/2023   Last metabolic panel Lab Results  Component Value Date   GLUCOSE 97 10/30/2023   NA 142 10/30/2023   K 4.1 10/30/2023   CL 105 10/30/2023   CO2 29 10/30/2023   BUN 16 10/30/2023   CREATININE 0.96 10/30/2023   GFR 75.22 10/30/2023   CALCIUM 9.3 10/30/2023   PROT 6.7 10/30/2023   ALBUMIN 4.5 10/30/2023   BILITOT 0.5 10/30/2023   ALKPHOS 54 10/30/2023   AST 17 10/30/2023   ALT 15 10/30/2023   ANIONGAP 11 04/27/2023       Assessment & Plan:   Assessment & Plan Chronic obstructive pulmonary disease, unspecified COPD type (HCC)  Mild intermittent asthma without complication   Assessment and Plan    Chronic obstructive pulmonary disease (COPD) Mild intermittent asthma Mild COPD likely due to smoking history. Symptoms include dyspnea and fatigue during heavy exercise. Prefers not to use inhaler consistently. - Continue current exercise regimen. - Use Symbicort  inhaler as needed, especially during heavy exercise if dyspnea occurs. - Educated on rinsing mouth after inhaler use to prevent oral side effects. - RTC 1 yr.   Stable pulmonary nodule, left lower lung 4 mm nodule in left lower lung stable on previous CT scans. He is 78  years old and will be 79 in 3 months and has met criteria for cessation of lung cancer screening. - No further imaging or intervention is required for the pulmonary nodule. Patient prefers not doing further screen.        No orders of the defined types were placed in this encounter.  I  personally spent a total of 25 minutes in the care of the patient today including preparing to see the patient, getting/reviewing separately obtained history, performing a medically appropriate exam/evaluation, counseling and educating, and documenting clinical information in the EHR.    Return in about 1 year (around 03/04/2025).   Bora Bost, MD

## 2024-03-11 ENCOUNTER — Telehealth: Payer: Self-pay | Admitting: Family Medicine

## 2024-03-11 DIAGNOSIS — I700 Atherosclerosis of aorta: Secondary | ICD-10-CM

## 2024-03-11 DIAGNOSIS — E7800 Pure hypercholesterolemia, unspecified: Secondary | ICD-10-CM

## 2024-03-11 NOTE — Telephone Encounter (Unsigned)
 Copied from CRM #[id number]. Topic: Clinical - Medication Refill >> Mar 11, 2024 11:53 AM Alfonso ORN wrote: Medication: ezetimibe  (ZETIA ) 10 MG tablet  Has the patient contacted their pharmacy? Yes  This is the patient's preferred pharmacy:  [id number], [place] - [address] AT [facility] [address] [place] [address] Phone: [phone] Fax: [phone]   Is this the correct pharmacy for this prescription? Yes   Has the prescription been filled recently? Yes  Is the patient out of the medication? No  Has the patient been seen for an appointment in the last year OR does the patient have an upcoming appointment? Yes  Can we respond through MyChart? No  Pt also requesting if can be given rx longer than 30 day supply

## 2024-03-14 MED ORDER — EZETIMIBE 10 MG PO TABS: 10.0000 mg | ORAL_TABLET | Freq: Every day | ORAL | 0 refills | Status: AC

## 2024-04-12 ENCOUNTER — Ambulatory Visit: Admitting: Family Medicine

## 2024-04-16 ENCOUNTER — Ambulatory Visit: Admitting: Family Medicine

## 2024-04-16 VITALS — BP 130/79 | HR 74 | Temp 98.0°F | Ht 73.0 in | Wt 202.8 lb

## 2024-04-16 DIAGNOSIS — R7303 Prediabetes: Secondary | ICD-10-CM

## 2024-04-16 DIAGNOSIS — I700 Atherosclerosis of aorta: Secondary | ICD-10-CM | POA: Diagnosis not present

## 2024-04-16 DIAGNOSIS — N401 Enlarged prostate with lower urinary tract symptoms: Secondary | ICD-10-CM | POA: Diagnosis not present

## 2024-04-16 DIAGNOSIS — J449 Chronic obstructive pulmonary disease, unspecified: Secondary | ICD-10-CM

## 2024-04-16 DIAGNOSIS — E291 Testicular hypofunction: Secondary | ICD-10-CM

## 2024-04-16 DIAGNOSIS — K130 Diseases of lips: Secondary | ICD-10-CM | POA: Diagnosis not present

## 2024-04-16 DIAGNOSIS — R3914 Feeling of incomplete bladder emptying: Secondary | ICD-10-CM

## 2024-04-16 DIAGNOSIS — E538 Deficiency of other specified B group vitamins: Secondary | ICD-10-CM | POA: Diagnosis not present

## 2024-04-16 DIAGNOSIS — E559 Vitamin D deficiency, unspecified: Secondary | ICD-10-CM

## 2024-04-16 DIAGNOSIS — K5909 Other constipation: Secondary | ICD-10-CM

## 2024-04-16 DIAGNOSIS — F419 Anxiety disorder, unspecified: Secondary | ICD-10-CM | POA: Diagnosis not present

## 2024-04-16 DIAGNOSIS — R400 Somnolence: Secondary | ICD-10-CM

## 2024-04-16 DIAGNOSIS — Z0000 Encounter for general adult medical examination without abnormal findings: Secondary | ICD-10-CM | POA: Diagnosis not present

## 2024-04-16 DIAGNOSIS — M791 Myalgia, unspecified site: Secondary | ICD-10-CM

## 2024-04-16 DIAGNOSIS — I10 Essential (primary) hypertension: Secondary | ICD-10-CM

## 2024-04-16 DIAGNOSIS — T733XXA Exhaustion due to excessive exertion, initial encounter: Secondary | ICD-10-CM | POA: Insufficient documentation

## 2024-04-16 DIAGNOSIS — K649 Unspecified hemorrhoids: Secondary | ICD-10-CM

## 2024-04-16 DIAGNOSIS — E782 Mixed hyperlipidemia: Secondary | ICD-10-CM

## 2024-04-16 LAB — MICROALBUMIN / CREATININE URINE RATIO
Creatinine,U: 156.4 mg/dL
Microalb Creat Ratio: 6.1 mg/g (ref 0.0–30.0)
Microalb, Ur: 1 mg/dL (ref 0.7–1.9)

## 2024-04-16 LAB — LIPID PANEL
Cholesterol: 212 mg/dL — ABNORMAL HIGH (ref 28–200)
HDL: 43.5 mg/dL
LDL Cholesterol: 136 mg/dL — ABNORMAL HIGH (ref 10–99)
NonHDL: 168.96
Total CHOL/HDL Ratio: 5
Triglycerides: 167 mg/dL — ABNORMAL HIGH (ref 10.0–149.0)
VLDL: 33.4 mg/dL (ref 0.0–40.0)

## 2024-04-16 LAB — CBC WITH DIFFERENTIAL/PLATELET
Basophils Absolute: 0 K/uL (ref 0.0–0.1)
Basophils Relative: 0.5 % (ref 0.0–3.0)
Eosinophils Absolute: 0.1 K/uL (ref 0.0–0.7)
Eosinophils Relative: 2.3 % (ref 0.0–5.0)
HCT: 44 % (ref 39.0–52.0)
Hemoglobin: 15.5 g/dL (ref 13.0–17.0)
Lymphocytes Relative: 25.7 % (ref 12.0–46.0)
Lymphs Abs: 1.5 K/uL (ref 0.7–4.0)
MCHC: 35.1 g/dL (ref 30.0–36.0)
MCV: 92.1 fl (ref 78.0–100.0)
Monocytes Absolute: 0.6 K/uL (ref 0.1–1.0)
Monocytes Relative: 10.5 % (ref 3.0–12.0)
Neutro Abs: 3.5 K/uL (ref 1.4–7.7)
Neutrophils Relative %: 61 % (ref 43.0–77.0)
Platelets: 287 K/uL (ref 150.0–400.0)
RBC: 4.78 Mil/uL (ref 4.22–5.81)
RDW: 12.8 % (ref 11.5–15.5)
WBC: 5.8 K/uL (ref 4.0–10.5)

## 2024-04-16 LAB — CK: Total CK: 72 U/L (ref 17–232)

## 2024-04-16 LAB — PSA: PSA: 0.61 ng/mL (ref 0.10–4.00)

## 2024-04-16 LAB — C-REACTIVE PROTEIN: CRP: <0.5 mg/dL — ABNORMAL LOW (ref 1.0–20.0)

## 2024-04-16 LAB — COMPREHENSIVE METABOLIC PANEL WITH GFR
ALT: 17 U/L (ref 3–53)
AST: 17 U/L (ref 5–37)
Albumin: 4.6 g/dL (ref 3.5–5.2)
Alkaline Phosphatase: 45 U/L (ref 39–117)
BUN: 15 mg/dL (ref 6–23)
CO2: 30 meq/L (ref 19–32)
Calcium: 9.3 mg/dL (ref 8.4–10.5)
Chloride: 106 meq/L (ref 96–112)
Creatinine, Ser: 0.93 mg/dL (ref 0.40–1.50)
GFR: 77.89 mL/min
Glucose, Bld: 93 mg/dL (ref 70–99)
Potassium: 4 meq/L (ref 3.5–5.1)
Sodium: 142 meq/L (ref 135–145)
Total Bilirubin: 0.6 mg/dL (ref 0.2–1.2)
Total Protein: 7.3 g/dL (ref 6.0–8.3)

## 2024-04-16 LAB — TESTOSTERONE: Testosterone: 236.14 ng/dL — ABNORMAL LOW (ref 300.00–890.00)

## 2024-04-16 LAB — SEDIMENTATION RATE: Sed Rate: 3 mm/h (ref 0–20)

## 2024-04-16 LAB — TSH: TSH: 2.28 u[IU]/mL (ref 0.35–5.50)

## 2024-04-16 LAB — HEMOGLOBIN A1C: Hgb A1c MFr Bld: 5.8 % (ref 4.6–6.5)

## 2024-04-16 LAB — VITAMIN B12: Vitamin B-12: 671 pg/mL (ref 211–911)

## 2024-04-16 MED ORDER — ALPRAZOLAM 0.25 MG PO TABS: ORAL_TABLET | ORAL | 1 refills | Status: AC

## 2024-04-16 MED ORDER — TAMSULOSIN HCL 0.4 MG PO CAPS: 0.8000 mg | ORAL_CAPSULE | Freq: Every day | ORAL | 3 refills | Status: AC

## 2024-04-16 MED ORDER — POLYETHYLENE GLYCOL 3350 17 GM/SCOOP PO POWD: 17.0000 g | Freq: Every day | ORAL | 1 refills | Status: AC | PRN

## 2024-04-16 NOTE — Progress Notes (Signed)
 " Assessment & Plan   Assessment/Plan:     Assessment & Plan Chronic disease management and preventive care Chronic disease management with emphasis on lifestyle modifications and preventive care. - Continue healthy lifestyle with low salt, heart-healthy diet, and moderate cardiovascular activity (150 minutes/week). - Encouraged low carbohydrate and reduced calorie intake with emphasis on whole grains and reduced saturated fats and sugary foods.  Mixed hyperlipidemia and aortic atherosclerosis Managed with ezetimibe  and Lovaza  omega-3 acid ethyl ester. Statins not tolerated due to myalgias. Discussed potential use of PCSK9 inhibitors if needed. - Ordered fasting lipid panel. - Continue ezetimibe  and Lovaza  omega-3 acid ethyl ester.  Primary hypertension Blood pressure at 130/79 mmHg, borderline elevated. Managed with lifestyle modifications. - Ordered CBC with differential and platelets to assess for hematologic dysfunction. - Ordered TSH to assess thyroid  function. - Ordered urinalysis with microscopic and reflex culture to assess renal function.  Chronic obstructive pulmonary disease Managed with Symbicort  as needed. Follow-up with pulmonology recommended. - Continue Symbicort  as needed. - Follow up with pulmonology as needed.  Benign prostatic hyperplasia with lower urinary tract symptoms Managed with tamsulosin . Discussed potential increase in dosage or alternative treatments if symptoms persist. - Ordered PSA to assess prostate health. - Consider increasing tamsulosin  dosage or alternative treatments if symptoms persist.  Prediabetes Monitored with hemoglobin A1c and fasting glucose. - Ordered hemoglobin A1c and fasting glucose.  Vitamin D  deficiency Managed with supplementation. - Ordered vitamin D  level.  Vitamin B12 deficiency Previously high levels, now reduced intake. Monitoring required. - Ordered vitamin B12 level. - Ordered CBC with differential and platelets  to screen for anemia.  Hypogonadism in male Not currently on testosterone  therapy. Monitoring required. - Ordered testosterone  level.  Myalgia and fatigue Possibly related to muscle fatigue. Differential includes autoimmune conditions. - Ordered creatine kinase and aldolase. - Ordered ESR, CRP, and ANA to screen for autoimmune conditions.  Daytime sleepiness and suspected sleep apnea Daytime sleepiness with suspected sleep apnea. Referral for home sleep study discussed. - Referred for home sleep study.  Lip lesion, possible neoplasm Lip lesion with possible neoplasm. Dermatology referral recommended. - Referred to dermatology for evaluation of lip lesion.  Chronic constipation with hemorrhoids Chronic constipation with hemorrhoids. Discussed potential treatments including Miralax  and pelvic floor exercises. - Recommended Miralax  for constipation management. - Encouraged pelvic floor exercises. - Encouraged increased fluid intake and dietary fiber.  Situational anxiety Managed with alprazolam  as needed. - Refilled alprazolam  prescription.     Medications Discontinued During This Encounter  Medication Reason   predniSONE  (DELTASONE ) 10 MG tablet    ipratropium (ATROVENT ) 0.06 % nasal spray    azithromycin (ZITHROMAX) 250 MG tablet    tamsulosin  (FLOMAX ) 0.4 MG CAPS capsule    ALPRAZolam  (XANAX ) 0.25 MG tablet Reorder    Return in about 6 months (around 10/14/2024) for BP, HLD.        Subjective:   Encounter date: 04/16/2024  Daniel Blake is a 79 y.o. male who has Epigastric pain; Reflux esophagitis; Elevated cholesterol; Benign prostatic hyperplasia with incomplete bladder emptying; Complex renal cyst; Atherosclerosis of aorta; Acute otitis externa of right ear; Tympanic membrane disorder, right; Anxiety; Dysfunction of both eustachian tubes; Excessive cerumen in left ear canal; Extrinsic asthma; Vitamin D  deficiency; Cough; Nasal congestion; Allergic rhinitis due to  pollen; Abnormal CXR; Age-related cataract; Light headedness; UTI symptoms; Hemorrhoids; Perirectal abscess; Peri-rectal abscess; Chronic constipation; Gastroesophageal reflux disease; Hypertension; Hyperlipidemia; Hypogonadism in male; Chronic vasomotor rhinitis; OAB (overactive bladder); Moderate persistent asthma with acute exacerbation; Chronic  obstructive pulmonary disease, unspecified COPD type (HCC); Prediabetes; B12 deficiency; Lip lesion; Myalgia; Fatigue due to excessive exertion; and Daytime sleepiness on their problem list..   He  has a past medical history of Allergy, Anxiety, Arthritis, Asthma, Cataract, Chicken pox, COPD (chronic obstructive pulmonary disease) (HCC), GERD (gastroesophageal reflux disease), Hyperlipidemia, Hypertension, and PUD (peptic ulcer disease).SABRA   He presents with chief complaint of Medical Management of Chronic Issues (6 mon f/u for Hld and fasting labs. No questions or concerns. Patient fasting ) .   Discussed the use of AI scribe software for clinical note transcription with the patient, who gave verbal consent to proceed.  History of Present Illness          Hyperlipidemia - Managed with Zetia  10 mg daily.    Anxiety  - Managed with alprazolam  0.25 mg as needed for situational anxiety and sleep. - Last GAD7 score: 0 in July 2025.     10/10/2023   10:14 AM 07/11/2019   10:33 AM  GAD 7 : Generalized Anxiety Score  Nervous, Anxious, on Edge 0 1  Control/stop worrying 0 0  Worry too much - different things 0 0  Trouble relaxing 0 0  Restless 0 1  Easily annoyed or irritable 0 1  Afraid - awful might happen 0 0  Total GAD 7 Score 0 3  Anxiety Difficulty Not difficult at all Not difficult at all       04/16/2024    8:12 AM 11/14/2023    8:11 AM 10/10/2023   10:14 AM 01/17/2020    8:09 AM 07/11/2019   10:33 AM  Depression screen PHQ 2/9  Decreased Interest 0 0 0 1 0  Down, Depressed, Hopeless 0 0 0 1 0  PHQ - 2 Score 0 0 0 2 0  Altered sleeping    0  1  Tired, decreased energy   0  2  Change in appetite   0  1  Feeling bad or failure about yourself    0  1  Trouble concentrating   0  0  Moving slowly or fidgety/restless   0  0  Suicidal thoughts   0  0  PHQ-9 Score   0   5   Difficult doing work/chores   Not difficult at all  Not difficult at all     Data saved with a previous flowsheet row definition     ROS  Past Surgical History:  Procedure Laterality Date   INCISION AND DRAINAGE PERIRECTAL ABSCESS N/A 04/25/2023   Procedure: IRRIGATION AND DEBRIDEMENT PERIRECTAL ABSCESS;  Surgeon: Teresa Lonni HERO, MD;  Location: [facility] OR;  Service: General;  Laterality: N/A;   TONSILLECTOMY  1962    Medications Ordered Prior to Encounter[1]  Family History  Problem Relation Age of Onset   Heart disease Mother    Hyperlipidemia Mother    Hypertension Mother    Asthma Father    COPD Father    Hyperlipidemia Father    Hypertension Father    Stroke Father    Arthritis Sister    Early death Sister    Heart disease Brother    Heart disease Brother    Heart attack Brother    Early death Brother     Social History   Socioeconomic History   Marital status: Married    Spouse name: Not on file   Number of children: Not on file   Years of education: Not on file   Highest education level: Not on  file  Occupational History   Not on file  Tobacco Use   Smoking status: Former    Current packs/day: 0.00    Types: Cigarettes    Quit date: 10/19/2017    Years since quitting: 6.4   Smokeless tobacco: Never   Tobacco comments:    Smoked off and on for over 45 years. Smoked about 1 ppd  Substance and Sexual Activity   Alcohol use: No   Drug use: No   Sexual activity: Never  Other Topics Concern   Not on file  Social History Narrative   Not on file   Social Drivers of Health   Tobacco Use: Medium Risk (03/04/2024)   Patient History    Smoking Tobacco Use: Former    Smokeless Tobacco Use: Never    Passive Exposure: Not  on Actuary Strain: Not on file  Food Insecurity: No Food Insecurity (04/25/2023)   Hunger Vital Sign    Worried About Running Out of Food in the Last Year: Never true    Ran Out of Food in the Last Year: Never true  Transportation Needs: No Transportation Needs (04/25/2023)   PRAPARE - Administrator, Civil Service (Medical): No    Lack of Transportation (Non-Medical): No  Physical Activity: Not on file  Stress: Not on file  Social Connections: Moderately Isolated (04/25/2023)   Social Connection and Isolation Panel    Frequency of Communication with Friends and Family: Three times a week    Frequency of Social Gatherings with Friends and Family: Three times a week    Attends Religious Services: Never    Active Member of Clubs or Organizations: No    Attends Banker Meetings: Never    Marital Status: Married  Catering Manager Violence: Not At Risk (04/25/2023)   Humiliation, Afraid, Rape, and Kick questionnaire    Fear of Current or Ex-Partner: No    Emotionally Abused: No    Physically Abused: No    Sexually Abused: No  Depression (PHQ2-9): Low Risk (04/16/2024)   Depression (PHQ2-9)    PHQ-2 Score: 0  Alcohol Screen: Not on file  Housing: Low Risk (04/25/2023)   Housing Stability Vital Sign    Unable to Pay for Housing in the Last Year: No    Number of Times Moved in the Last Year: 0    Homeless in the Last Year: No  Utilities: Not At Risk (04/25/2023)   [facility] Utilities    Threatened with loss of utilities: No  Health Literacy: Not on file                                                                                                  Objective:  Physical Exam: BP 130/79   Pulse 74   Temp 98 F (36.7 C)   Ht 6' 1 (1.854 m)   Wt 202 lb 12.8 oz (92 kg)   SpO2 96%   BMI 26.76 kg/m    Physical Exam VITALS: BP- 130/79 GENERAL: Alert, cooperative, well developed, no acute distress. HEENT: Normocephalic, normal oropharynx, lesion  on  lower lip CHEST: Clear to auscultation bilaterally, no wheezes, rhonchi, or crackles. CARDIOVASCULAR: Normal heart rate and rhythm, S1 and S2 normal without murmurs. ABDOMEN: Soft, non-tender, non-distended, without organomegaly, normal bowel sounds. EXTREMITIES: No cyanosis or edema. NEUROLOGICAL: Cranial nerves grossly intact, moves all extremities without gross motor or sensory deficit.    Physical Exam  No results found.  Recent Results (from the past 2160 hours)  Pulmonary Function Test     Status: None   Collection Time: 03/04/24  8:16 AM  Result Value Ref Range   FVC-Pre 4.17 L   FVC-%Pred-Pre 90 %   FVC-Post 4.55 L   FVC-%Pred-Post 99 %   FVC-%Change-Post 9 %   FEV1-Pre 2.75 L   FEV1-%Pred-Pre 83 %   FEV1-Post 3.04 L   FEV1-%Pred-Post 92 %   FEV1-%Change-Post 10 %   FEV6-Pre 4.08 L   FEV6-%Pred-Pre 94 %   FEV6-Post 4.40 L   FEV6-%Pred-Post 102 %   FEV6-%Change-Post 7 %   Pre FEV1/FVC ratio 66 %   FEV1FVC-%Pred-Pre 91 %   Post FEV1/FVC ratio 67 %   FEV1FVC-%Change-Post 1 %   Pre FEV6/FVC Ratio 98 %   FEV6FVC-%Pred-Pre 104 %   Post FEV6/FVC ratio 97 %   FEV6FVC-%Pred-Post 103 %   FEV6FVC-%Change-Post -1 %   FEF 25-75 Pre 1.51 L/sec   FEF2575-%Pred-Pre 65 %   FEF 25-75 Post 2.26 L/sec   FEF2575-%Pred-Post 97 %   FEF2575-%Change-Post 50 %   RV 3.21 L   RV % pred 114 %   TLC 7.80 L   TLC % pred 101 %   DLCO unc 14.80 ml/min/mmHg   DLCO unc % pred 55 %   DLCO cor 14.80 ml/min/mmHg   DLCO cor % pred 55 %   DL/VA 7.79 ml/min/mmHg/L   DL/VA % pred 56 %        Beverley KATHEE Hummer, MD  I,Emily Lagle,acting as a scribe for Beverley KATHEE Hummer, MD.,have documented all relevant documentation on the behalf of Beverley KATHEE Hummer, MD.  LILLETTE Beverley KATHEE Hummer, MD, have reviewed all documentation for this visit. The documentation on 04/16/2024 for the exam, diagnosis, procedures, and orders are all accurate and complete.     [1]  Current Outpatient Medications on File  Prior to Visit  Medication Sig Dispense Refill   acetaminophen  (TYLENOL ) 500 MG tablet Take 2 tablets (1,000 mg total) by mouth every 6 (six) hours as needed.     Albuterol -Budesonide  (AIRSUPRA ) 90-80 MCG/ACT AERO Inhale 1 puff into the lungs every 6 (six) hours as needed (sob, wheezing,). 32.1 g 3   budesonide -formoterol  (SYMBICORT ) 160-4.5 MCG/ACT inhaler Inhale 2 puffs into the lungs in the morning and at bedtime. (Patient taking differently: Inhale 2 puffs into the lungs as needed.) 1 each 6   cholecalciferol  (VITAMIN D ) 1000 units tablet Take 1,000 Units by mouth daily.     EPINEPHrine  0.3 mg/0.3 mL IJ SOAJ injection Inject 0.3 mLs (0.3 mg total) into the muscle as needed for anaphylaxis. INJECT 0.3ML INTO THE MUSCLE ONCE FOR ONE DOSE AS DIRECTED 1 each 4   ezetimibe  (ZETIA ) 10 MG tablet Take 1 tablet (10 mg total) by mouth daily. 90 tablet 0   omega-3 acid ethyl esters (LOVAZA ) 1 g capsule Take 1 g by mouth daily.     PROAIR  RESPICLICK 108 (90 Base) MCG/ACT AEPB Inhale into the lungs.     vitamin B-12 (CYANOCOBALAMIN ) 100 MCG tablet Take 100 mcg by mouth daily. (Patient not taking: Reported on 04/16/2024)  No current facility-administered medications on file prior to visit.   "

## 2024-04-16 NOTE — Patient Instructions (Addendum)
 It was very nice to see you today!  VISIT SUMMARY: Today, we reviewed your chronic conditions and made adjustments to your treatment plan. We discussed your hyperlipidemia, hypertension, COPD, BPH, prediabetes, vitamin deficiencies, hypogonadism, myalgia, sleep issues, lip lesion, constipation, and anxiety. We ordered several tests and made referrals to specialists as needed.  YOUR PLAN: MIXED HYPERLIPIDEMIA AND AORTIC ATHEROSCLEROSIS: You have high cholesterol and atherosclerosis, which are being managed with medication. -Continue taking ezetimibe  and Lovaza  omega-3 acid ethyl ester. -We ordered a fasting lipid panel to monitor your cholesterol levels.  PRIMARY HYPERTENSION: Your blood pressure is slightly elevated. -We ordered a CBC with differential and platelets, TSH, and urinalysis to check your overall health and kidney function.  CHRONIC OBSTRUCTIVE PULMONARY DISEASE (COPD): You have COPD, which is being managed with Symbicort . -Continue using Symbicort  as needed. -Follow up with your pulmonologist as needed.  BENIGN PROSTATIC HYPERPLASIA (BPH) WITH LOWER URINARY TRACT SYMPTOMS: You have an enlarged prostate causing urinary symptoms. -Continue taking tamsulosin . -We ordered a PSA test to check your prostate health. -Consider increasing tamsulosin  dosage or alternative treatments if symptoms persist.  PREDIABETES: You have prediabetes, which requires monitoring. -We ordered hemoglobin A1c and fasting glucose tests to monitor your blood sugar levels.  VITAMIN D  DEFICIENCY: You have low vitamin D  levels. -Continue taking your vitamin D  supplements. -We ordered a vitamin D  level test to monitor your levels.  VITAMIN B12 DEFICIENCY: You have a history of high vitamin B12 levels, now reduced. -We ordered a vitamin B12 level test and a CBC with differential and platelets to monitor your levels and check for anemia.  HYPOGONADISM: You have low testosterone  levels. -We ordered a  testosterone  level test to monitor your condition.  MYALGIA AND FATIGUE: You experience muscle soreness and fatigue. -We ordered tests including creatine kinase, aldolase, ESR, CRP, and ANA to check for muscle and autoimmune conditions.  DAYTIME SLEEPINESS AND SUSPECTED SLEEP APNEA: You have daytime sleepiness and may have sleep apnea. -We referred you for a home sleep study.  LIP LESION, POSSIBLE NEOPLASM: You have a dark spot on your lip that needs further evaluation. -We referred you to dermatology for evaluation of the lip lesion.  CHRONIC CONSTIPATION WITH HEMORRHOIDS: You have chronic constipation and hemorrhoids. -We recommended Miralax  for constipation management. -Encouraged pelvic floor exercises, increased fluid intake, and dietary fiber.  SITUATIONAL ANXIETY: You have anxiety that is managed with medication. -We refilled your alprazolam  prescription.  Return in about 6 months (around 10/14/2024) for BP, HLD.   Take care, Arvella Hummer, MD, MS   PLEASE NOTE:  If you had any lab tests, please let us  know if you have not heard back within a few days. You may see your results on mychart before we have a chance to review them but we will give you a call once they are reviewed by us .   If we ordered any referrals today, please let us  know if you have not heard from their office within the next week.   If you had any urgent prescriptions sent in today, please check with the pharmacy within an hour of our visit to make sure the prescription was transmitted appropriately.   Please try these tips to maintain a healthy lifestyle:  Eat at least 3 REAL meals and 1-2 snacks per day.  Aim for no more than 5 hours between eating.  If you eat breakfast, please do so within one hour of getting up.   Each meal should contain half fruits/vegetables, one quarter  protein, and one quarter carbs (no bigger than a computer mouse)  Cut down on sweet beverages. This includes juice, soda, and  sweet tea.   Drink at least 1 glass of water with each meal and aim for at least 8 glasses per day  Exercise at least 150 minutes every week.

## 2024-04-19 LAB — ANTI-NUCLEAR AB-TITER (ANA TITER): ANA Titer 1: 1:40 {titer} — ABNORMAL HIGH

## 2024-04-19 LAB — URINALYSIS W MICROSCOPIC + REFLEX CULTURE
Bacteria, UA: NONE SEEN /HPF
Bilirubin Urine: NEGATIVE
Glucose, UA: NEGATIVE
Hgb urine dipstick: NEGATIVE
Hyaline Cast: NONE SEEN /LPF
Ketones, ur: NEGATIVE
Leukocyte Esterase: NEGATIVE
Nitrites, Initial: NEGATIVE
Protein, ur: NEGATIVE
RBC / HPF: NONE SEEN /HPF (ref 0–2)
Specific Gravity, Urine: 1.019 (ref 1.001–1.035)
Squamous Epithelial / HPF: NONE SEEN /HPF
WBC, UA: NONE SEEN /HPF (ref 0–5)
pH: 6.5 (ref 5.0–8.0)

## 2024-04-19 LAB — ALDOLASE

## 2024-04-19 LAB — TIQ-NTM

## 2024-04-19 LAB — VITAMIN D 1,25 DIHYDROXY
Vitamin D 1, 25 (OH)2 Total: 36 pg/mL (ref 18–72)
Vitamin D2 1, 25 (OH)2: <8 pg/mL
Vitamin D3 1, 25 (OH)2: 36 pg/mL

## 2024-04-19 LAB — NO CULTURE INDICATED

## 2024-04-19 LAB — ANA: Anti Nuclear Antibody (ANA): POSITIVE — AB

## 2024-04-21 ENCOUNTER — Encounter: Payer: Self-pay | Admitting: Family Medicine

## 2024-04-22 ENCOUNTER — Ambulatory Visit: Payer: Self-pay | Admitting: Family Medicine

## 2024-04-22 DIAGNOSIS — I700 Atherosclerosis of aorta: Secondary | ICD-10-CM

## 2024-04-22 DIAGNOSIS — M791 Myalgia, unspecified site: Secondary | ICD-10-CM

## 2024-04-29 MED ORDER — WEGOVY 0.25 MG/0.5ML ~~LOC~~ SOAJ: 0.2500 mg | SUBCUTANEOUS | 0 refills | Status: AC

## 2024-04-29 NOTE — Addendum Note (Signed)
 Addended by: SEBASTIAN CROCK B on: 04/29/2024 04:04 PM   Modules accepted: Orders

## 2024-04-30 NOTE — Telephone Encounter (Signed)
 Last read by Arley Gill at 5:57PM on 04/29/2024.

## 2024-05-01 ENCOUNTER — Other Ambulatory Visit (HOSPITAL_COMMUNITY): Payer: Self-pay

## 2024-05-07 ENCOUNTER — Ambulatory Visit: Admitting: Dermatology

## 2024-05-07 ENCOUNTER — Encounter: Payer: Self-pay | Admitting: Dermatology

## 2024-05-07 DIAGNOSIS — L821 Other seborrheic keratosis: Secondary | ICD-10-CM

## 2024-05-07 DIAGNOSIS — R229 Localized swelling, mass and lump, unspecified: Secondary | ICD-10-CM

## 2024-05-07 DIAGNOSIS — W908XXA Exposure to other nonionizing radiation, initial encounter: Secondary | ICD-10-CM

## 2024-05-07 DIAGNOSIS — R238 Other skin changes: Secondary | ICD-10-CM

## 2024-05-07 DIAGNOSIS — L570 Actinic keratosis: Secondary | ICD-10-CM

## 2024-05-07 DIAGNOSIS — R220 Localized swelling, mass and lump, head: Secondary | ICD-10-CM

## 2024-05-07 NOTE — Patient Instructions (Signed)

## 2024-05-07 NOTE — Progress Notes (Signed)
 "  New Patient Visit   History of Present Illness Daniel Blake is a 79 year old male who presents with concerns about a spot on his lip and a bump on his scalp.  He has had a spot on his lip for approximately ten years, initially noticed by his dentist. There has been no significant change in the spot over time, but he experiences occasional itching below his lip, particularly when lying down.  He also has a bump on his scalp, described as a 'bump' that has been present for as long as he can remember.  He has a history of significant sun exposure, resulting in areas on his cheeks that become scaly if not moisturized. He uses lotion to manage the scaling. He has previously undergone treatment for sun damage but found it intolerable due to discomfort.  He is concerned about the risk of skin cancer and is interested in managing the scaling.   Pt has a growth on his lip he'd like evaluated. Its been present around 10 yrs. He has no hx of skin cancer  The following portions of the chart were reviewed this encounter and updated as appropriate: medications, allergies, medical history  Review of Systems:  No other skin or systemic complaints except as noted in HPI or Assessment and Plan.  Objective  Well appearing patient in no apparent distress; mood and affect are within normal limits.  A focused examination was performed of the following areas: lip       Relevant exam findings are noted in the Assessment and Plan.    Assessment & Plan   [place] Exam: red or purple papule at vermilion lip  Treatment Plan: Benign-appearing. Observe    Subcutaneous Nodule- right parietal scalp- Ddx lipoma vs pilar cyst vs other Exam: Subcutaneous rubbery nodule(s) Location: scalp  Benign-appearing. Exam most consistent with an Lipoma. Discussed that a Lipoma is a benign fatty growth that can grow over time and sometimes become painful or otherwise symptomatic. Some patients may have one or  several lipomas.. Benign Hereditary Lipomatosis is a hereditary familial condition where family members tend to grow multiple lipomas.  Recommend observation if it is not changing, growing or symptomatic. Recommend surgical excision to remove it if it is painful, growing, symptomatic, or other changes noted. Please contact our office for new or changing lesions so they can be evaluated.   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 - Observe - Call for any changes  ACTINIC KERATOSIS Exam: Erythematous thin papules/macules with gritty scale at the bilateral cheeks  Actinic keratoses are precancerous spots that appear secondary to cumulative UV radiation exposure/sun exposure over time. They are chronic with expected duration over 1 year. A portion of actinic keratoses will progress to squamous cell carcinoma of the skin. It is not possible to reliably predict which spots will progress to skin cancer and so treatment is recommended to prevent development of skin cancer.  Recommend daily broad spectrum sunscreen SPF 30+ to sun-exposed areas, reapply every 2 hours as needed.  Recommend staying in the shade or wearing long sleeves, sun glasses (UVA+UVB protection) and wide brim hats (4-inch brim around the entire circumference of the hat). Call for new or changing lesions.  Treatment Plan: Discussed topical 5FU, patient declines for now Discussed the lesions are too wide to consider cryotherapy    No follow-ups on file.  LILLETTE Darice Smock, CMA, am acting as  scribe for RUFUS CHRISTELLA HOLY, MD.   Documentation: I have reviewed the above documentation for accuracy and completeness, and I agree with the above.  RUFUS CHRISTELLA HOLY, MD    "

## 2024-05-08 ENCOUNTER — Other Ambulatory Visit (HOSPITAL_COMMUNITY): Payer: Self-pay

## 2024-05-09 ENCOUNTER — Telehealth: Payer: Self-pay | Admitting: Pharmacy Technician

## 2024-05-09 ENCOUNTER — Other Ambulatory Visit (HOSPITAL_COMMUNITY): Payer: Self-pay

## 2024-05-09 NOTE — Telephone Encounter (Signed)
 Pharmacy Patient Advocate Encounter   Received notification from [employer] that prior authorization for Wegovy  0.25MG /0.5ML auto-injectors  is required/requested.   Insurance verification completed.   The patient is insured through [facility].   Per test claim: PA required; PA submitted to above mentioned insurance via Latent Key/confirmation #/[employer] Status is pending

## 2024-05-10 NOTE — Telephone Encounter (Signed)
 Patient Wegovy  has been denied, states they need documentation establishing cardiovascular disease as evidenced by one of the following: Prior myocardial infarction, prior stroke, peripheral arterial disease (that is, intermittent claudication with ankle-brachial index less than 0.85, peripheral arterial revascularization procedure, or amputation due to atherosclerotic disease).

## 2024-05-10 NOTE — Telephone Encounter (Signed)
 Pharmacy Patient Advocate Encounter  Received notification from [facility] MEDICARE that Prior Authorization for Wegovy  0.25MG /0.5ML auto-injectors  has been DENIED.  Full denial letter will be uploaded to the media tab. See denial reason below. WEGOVY  INJ 0.25MG  is denied for not meeting the prior authorization requirement(s). Medication authorization requires the following: (1) Your provider submits medical records (for example: chart notes) documenting established cardiovascular disease as evidenced by one of the following: Prior myocardial infarction, prior stroke, peripheral arterial disease (that is, intermittent claudication with ankle-brachial index less than 0.85, peripheral arterial revascularization procedure, or amputation due to atherosclerotic disease). (2) Your provider submits medical records (for example: chart notes) documenting you have a body mass index greater than or equal to 27kg/m2  PA #/Case ID/Reference #: [id number]

## 2024-05-15 ENCOUNTER — Other Ambulatory Visit: Payer: Self-pay

## 2024-05-15 ENCOUNTER — Ambulatory Visit: Admitting: Internal Medicine

## 2024-05-15 ENCOUNTER — Encounter: Payer: Self-pay | Admitting: Internal Medicine

## 2024-05-15 VITALS — BP 126/78 | HR 68 | Temp 98.2°F | Resp 18 | Ht 72.5 in | Wt 205.7 lb

## 2024-05-15 DIAGNOSIS — J310 Chronic rhinitis: Secondary | ICD-10-CM

## 2024-05-15 MED ORDER — NEFFY 2 MG/0.1ML NA SOLN: 1.0000 | NASAL | 1 refills | Status: AC | PRN

## 2024-05-15 MED ORDER — IPRATROPIUM BROMIDE 0.03 % NA SOLN: 2.0000 | Freq: Three times a day (TID) | NASAL | 12 refills | Status: AC | PRN

## 2024-05-15 NOTE — Progress Notes (Signed)
 "  NEW PATIENT Date of Service/Encounter:   05/15/2024 Referring provider: Sebastian Beverley NOVAK, MD Primary care provider: Sebastian Beverley NOVAK, MD  Subjective:  Daniel Blake is a 79 y.o. male with a PMHx of elevated cholesterol, atherosclerosis, age-related cataracts, GERD, hypertension COPD, prediabetes, B12 deficiency presenting today for evaluation of chronic rhinitis and history of venom anaphylaxis. History obtained from: chart review and patient.   Discussed the use of AI scribe software for clinical note transcription with the patient, who gave verbal consent to proceed.  History of Present Illness Daniel Blake is a 79 year old male with sinus issues and COPD who presents with fatigue and sinus congestion. He was referred by his primary care physician for evaluation of fatigue and an elevated ANA.  Fatigue - Significant fatigue over the past year, associated with sinus congestion - Fatigue persists despite regular exercise, though increased activity has provided some improvement - Continues to experience tiredness after activities such as golf  Sinus congestion and rhinorrhea - Chronic sinus congestion, worsened after exercise - Increased rhinorrhea, particularly in winter - Experiences sinusitis a couple of times per year, which can be painful - No follow-up with ENT since last visit with Dr. Solomon approximately 1.5 years ago - Uses nasal inhaler and neti pot with saline solution for symptom relief, using tap water - Occasionally takes Benadryl, but not regularly - No improvement with allergy pills - Prefers to minimize medication intake  Allergic history - History of bee stings and completed allergy shots - bee stings since completing immunotherapy without symptoms  Pulmonary symptoms and exercise tolerance - COPD and mild asthma, managed with annual pulmonary specialist visits - Exercises regularly: running, golf, elliptical, treadmill, and weights - Performed well on  treadmill heart test, reaching maximum levels without issues  Denies childhood history of hayfever or similar.     Chart Review:  Reviewed PCP notes from referral 04/24/24: referred to immunology per patient request due to fatigue, slightly elevated ANA -ANA elevated 04/16/2024 1:40, normal CRP and ESR, normal total CK  Past Medical History: Past Medical History:  Diagnosis Date   Allergy    Anxiety    Arthritis    Asthma    Cataract    Chicken pox    COPD (chronic obstructive pulmonary disease) (HCC)    GERD (gastroesophageal reflux disease)    Hyperlipidemia    Hypertension    PUD (peptic ulcer disease)    Medication List:  Current Outpatient Medications  Medication Sig Dispense Refill   Albuterol -Budesonide  (AIRSUPRA ) 90-80 MCG/ACT AERO Inhale 1 puff into the lungs every 6 (six) hours as needed (sob, wheezing,). 32.1 g 3   ALPRAZolam  (XANAX ) 0.25 MG tablet TAKE 1 TABLET BY MOUTH TWICE DAILY AS NEEDED FOR SLEEP OR ANXIETY 60 tablet 1   budesonide -formoterol  (SYMBICORT ) 160-4.5 MCG/ACT inhaler Inhale 2 puffs into the lungs in the morning and at bedtime. (Patient taking differently: Inhale 2 puffs into the lungs as needed.) 1 each 6   cholecalciferol  (VITAMIN D ) 1000 units tablet Take 1,000 Units by mouth daily.     EPINEPHrine  0.3 mg/0.3 mL IJ SOAJ injection Inject 0.3 mLs (0.3 mg total) into the muscle as needed for anaphylaxis. INJECT 0.3ML INTO THE MUSCLE ONCE FOR ONE DOSE AS DIRECTED 1 each 4   ezetimibe  (ZETIA ) 10 MG tablet Take 1 tablet (10 mg total) by mouth daily. 90 tablet 0   omega-3 acid ethyl esters (LOVAZA ) 1 g capsule Take 1 g by mouth daily.  polyethylene glycol powder (GLYCOLAX /MIRALAX ) 17 GM/SCOOP powder Take 17 g by mouth daily as needed (constipation). 3350 g 1   PROAIR  RESPICLICK 108 (90 Base) MCG/ACT AEPB Inhale into the lungs.     semaglutide -weight management (WEGOVY ) 0.25 MG/0.5ML SOAJ SQ injection Inject 0.25 mg into the skin once a week. 2 mL 0    tamsulosin  (FLOMAX ) 0.4 MG CAPS capsule Take 2 capsules (0.8 mg total) by mouth daily after supper. 180 capsule 3   vitamin B-12 (CYANOCOBALAMIN ) 100 MCG tablet Take 100 mcg by mouth daily. (Patient not taking: Reported on 04/16/2024)     No current facility-administered medications for this visit.   Known Allergies:  Allergies[1] Past Surgical History: Past Surgical History:  Procedure Laterality Date   INCISION AND DRAINAGE PERIRECTAL ABSCESS N/A 04/25/2023   Procedure: IRRIGATION AND DEBRIDEMENT PERIRECTAL ABSCESS;  Surgeon: Daniel Lonni HERO, MD;  Location: [facility] OR;  Service: General;  Laterality: N/A;   TONSILLECTOMY  1962   Family History: Family History  Problem Relation Age of Onset   Heart disease Mother    Hyperlipidemia Mother    Hypertension Mother    Asthma Father    COPD Father    Hyperlipidemia Father    Hypertension Father    Stroke Father    Arthritis Sister    Early death Sister    Heart disease Brother    Heart disease Brother    Heart attack Brother    Early death Brother    Social History: Daniel Blake lives in a home built 43 years ago, no water damage, carpet in the bedroom, electrocuting, no pets, no roaches, not using dust mite covers on the bed of the pillows, no smoke exposure.  Retired.  Not exposed to fumes chemicals or dust.  No HEPA filter in the home.  [place]   ROS:  All other systems negative except as noted per HPI.  Objective:  Blood pressure 126/78, pulse 68, temperature 98.2 F (36.8 C), temperature source Temporal, resp. rate 18, height 6' 0.5 (1.842 m), weight 205 lb 11.2 oz (93.3 kg), SpO2 96%. Body mass index is 27.51 kg/m. Physical Exam:  General Appearance:  Alert, cooperative, no distress, appears stated age  Head:  Normocephalic, without obvious abnormality, atraumatic  Eyes:  Conjunctiva clear, EOM's intact  Ears EACs normal bilaterally and normal TMs bilaterally  Nose: Nares normal, normal mucosa  and no visible anterior polyps  Throat: Lips, tongue normal; teeth and gums normal, normal posterior oropharynx  Neck: Supple, symmetrical  Lungs:   clear to auscultation bilaterally, Respirations unlabored, no coughing  Heart:  regular rate and rhythm and no murmur, Appears well perfused  Extremities: No edema  Skin: Skin color, texture, turgor normal and no rashes or lesions on visualized portions of skin  Neurologic: No gross deficits   Diagnostics:  Labs:  Lab Orders  No laboratory test(s) ordered today     Assessment and Plan  Assessment and Plan Assessment & Plan 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 No significant allergy history. Differential includes vasomotor rhinitis due to nerve stimulation. Previous ENT evaluation did not indicate need for further intervention. Current management includes nasal spray and saline rinses. Discussed potential for vasomotor rhinitis and limited treatment options if nonallergic. Surgery to ablate nasal nerves carries risk of excessive dryness. - Scheduled allergy testing for next week to rule out allergic component. - Prescribed Atrovent  nasal spray for rhinorrhea, to be used 1- sprays up  to three times daily. Stop if affects urine flow. - Advised against regular use of Benadryl due to potential dementia risk; recommended newer antihistamines like Zyrtec , Claritin, Allegra, or Xyzal once daily only if needed. - Instructed to avoid antihistamines three days prior to allergy testing. - Recommended use of saline mist or rinse with distilled water to maintain nasal clarity.  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 Recent reactions to bee stings without any reactions. Discussed high false positive rate of allergy testing and limited predictive value. Shared decision making of having an epinephrine  device due  to history of anaphylaxis. Discussed nasal spray EpiPen  as a more heat-stable alternative. - Prescribed nasal spray EpiPen  for emergency use, to be sent to [facility] pharmacy. - Advised keeping EpiPen  in golf bag for accessibility.  Regarding ANA and myalgias - this would be best addressed by Rheumatology  Follow up : next Wednesday February 11th at 9 AM (1-55) must be off all antihistamine 3 days prior to visit. It was a pleasure meeting you in clinic today! Thank you for allowing me to participate in your care.  Rocky Endow, MD [facility]        This note in its entirety was forwarded to the Provider who requested this consultation.  Other: emergency action plan provided   Thank you for your kind referral. I appreciate the opportunity to take part in Irby's care. Please do not hesitate to contact me with questions.  Sincerely,  Rocky Endow, MD [facility]          [1]  Allergies Allergen Reactions   Bee Venom Anaphylaxis   Amlodipine Other (See Comments)    Tired , foggy feeling  Tired , foggy feeling     Pravastatin Other (See Comments)    Myalgia Myalgia    Rosuvastatin Other (See Comments)   "

## 2024-05-15 NOTE — Patient Instructions (Signed)
 Chronic rhinitis Chronic nasal congestion and rhinorrhea, exacerbated by exercise and winter weather. No significant allergy history. Differential includes vasomotor rhinitis due to nerve stimulation. Previous ENT evaluation did not indicate need for further intervention. Current management includes nasal spray and saline rinses. Discussed potential for vasomotor rhinitis and limited treatment options if nonallergic. Surgery to ablate nasal nerves carries risk of excessive dryness. - Scheduled allergy testing for next week to rule out allergic component. - Prescribed Atrovent  nasal spray for rhinorrhea, to be used 1- sprays up to three times daily. Stop if affects urine flow. - Advised against regular use of Benadryl due to potential dementia risk; recommended newer antihistamines like Zyrtec , Claritin, Allegra, or Xyzal once daily only if needed. - Instructed to avoid antihistamines three days prior to allergy testing. - Recommended use of saline mist or rinse with distilled water to maintain nasal clarity.  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 Recent reactions to bee stings without any reactions. Discussed high false positive rate of allergy testing and limited predictive value. Shared decision making of having an epinephrine  device due to history of anaphylaxis. Discussed nasal spray EpiPen  as a more heat-stable alternative. - Prescribed nasal spray EpiPen  for emergency use, to be sent to [facility] pharmacy. - Advised keeping EpiPen  in golf bag for accessibility.  Follow up : next Wednesday February 11th at 9 AM (1-55) must be off all antihistamine 3 days prior to visit. It was a pleasure meeting you in clinic today! Thank you for allowing me to participate in your care.  Rocky Endow, MD [facility]

## 2024-05-22 ENCOUNTER — Ambulatory Visit: Admitting: Internal Medicine

## 2024-05-27 ENCOUNTER — Ambulatory Visit: Admitting: Internal Medicine

## 2024-10-14 ENCOUNTER — Ambulatory Visit: Admitting: Family Medicine
